# Patient Record
Sex: Male | Born: 2000
Health system: Southern US, Community
[De-identification: ages and names within clinical notes are randomized; demographics above are authoritative.]

## PROBLEM LIST (undated history)

## (undated) DIAGNOSIS — F209 Schizophrenia, unspecified: Secondary | ICD-10-CM

## (undated) DIAGNOSIS — F319 Bipolar disorder, unspecified: Secondary | ICD-10-CM

## (undated) DIAGNOSIS — F952 Tourette's disorder: Secondary | ICD-10-CM

---
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
## 2022-04-06 ENCOUNTER — Encounter (HOSPITAL_COMMUNITY): Payer: Self-pay

## 2022-04-06 ENCOUNTER — Emergency Department (HOSPITAL_COMMUNITY)
Admission: EM | Admit: 2022-04-06 | Discharge: 2022-04-07 | Disposition: A | Discharge status: Home / Self Care | Payer: Medicaid Other | Attending: Emergency Medicine | Admitting: Emergency Medicine

## 2022-04-06 DIAGNOSIS — Z20822 Contact with and (suspected) exposure to covid-19: Secondary | ICD-10-CM | POA: Insufficient documentation

## 2022-04-06 DIAGNOSIS — F39 Unspecified mood [affective] disorder: Secondary | ICD-10-CM | POA: Diagnosis not present

## 2022-04-06 DIAGNOSIS — F1290 Cannabis use, unspecified, uncomplicated: Secondary | ICD-10-CM | POA: Diagnosis not present

## 2022-04-06 DIAGNOSIS — Z046 Encounter for general psychiatric examination, requested by authority: Secondary | ICD-10-CM | POA: Diagnosis not present

## 2022-04-06 DIAGNOSIS — R440 Auditory hallucinations: Secondary | ICD-10-CM | POA: Diagnosis present

## 2022-04-06 LAB — CBC
HCT: 37.4 % — ABNORMAL LOW (ref 39.0–52.0)
Hemoglobin: 12.6 g/dL — ABNORMAL LOW (ref 13.0–17.0)
MCH: 22.1 pg — ABNORMAL LOW (ref 26.0–34.0)
MCHC: 33.7 g/dL (ref 30.0–36.0)
MCV: 65.5 fL — ABNORMAL LOW (ref 80.0–100.0)
Platelets: 232 10*3/uL (ref 150–400)
RBC: 5.71 MIL/uL (ref 4.22–5.81)
RDW: 18.8 % — ABNORMAL HIGH (ref 11.5–15.5)
WBC: 6.2 10*3/uL (ref 4.0–10.5)
nRBC: 0 % (ref 0.0–0.2)

## 2022-04-06 LAB — COMPREHENSIVE METABOLIC PANEL
ALT: 13 U/L (ref 0–44)
AST: 16 U/L (ref 15–41)
Albumin: 4.1 g/dL (ref 3.5–5.0)
Alkaline Phosphatase: 58 U/L (ref 38–126)
Anion gap: 6 (ref 5–15)
BUN: 8 mg/dL (ref 6–20)
CO2: 28 mmol/L (ref 22–32)
Calcium: 9.1 mg/dL (ref 8.9–10.3)
Chloride: 107 mmol/L (ref 98–111)
Creatinine, Ser: 0.91 mg/dL (ref 0.61–1.24)
GFR, Estimated: >60 mL/min (ref >60)
Glucose, Bld: 72 mg/dL (ref 70–99)
Potassium: 4.5 mmol/L (ref 3.5–5.1)
Sodium: 141 mmol/L (ref 135–145)
Total Bilirubin: 0.6 mg/dL (ref 0.3–1.2)
Total Protein: 7.2 g/dL (ref 6.5–8.1)

## 2022-04-06 LAB — RESP PANEL BY RT-PCR (FLU A&B, COVID) ARPGX2
Influenza A by PCR: NEGATIVE
Influenza B by PCR: NEGATIVE
SARS Coronavirus 2 by RT PCR: NEGATIVE

## 2022-04-06 LAB — ETHANOL: Alcohol, Ethyl (B): <10 mg/dL (ref <10)

## 2022-04-06 LAB — ACETAMINOPHEN LEVEL: Acetaminophen (Tylenol), Serum: <10 ug/mL — ABNORMAL LOW (ref 10–30)

## 2022-04-06 LAB — SALICYLATE LEVEL: Salicylate Lvl: <7 mg/dL — ABNORMAL LOW (ref 7.0–30.0)

## 2022-04-06 NOTE — ED Notes (Signed)
Pt has been seen and wand by security.   Pt has 2 bags of belongings.  Includes a cellphone  and ear pods.
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
## 2022-04-06 NOTE — ED Provider Triage Note (Signed)
Emergency Medicine Provider Triage Evaluation Note  Tawfiq Favila , a 21 y.o. male  was evaluated in triage.  Patient presents under IVC.  Per IVC patient responding to internal stimuli, physically aggressive.  Patient states hs does not need to be here- denies si, hi, hallucnations. Denies pain  Review of Systems  Per above  Physical Exam  BP 105/60 (BP Location: Left Arm)   Pulse 65   Temp 98.4 F (36.9 C) (Oral)   Resp 18   SpO2 100%  Gen:   Awake, no distress   Resp:  Normal effort  MSK:   Moves extremities without difficulty  Other:    Medical Decision Making  Medically screening exam initiated at 2:29 PM.  Appropriate orders placed.  Dawood Spitler was informed that the remainder of the evaluation will be completed by another provider, this initial triage assessment does not replace that evaluation, and the importance of remaining in the ED until their evaluation is complete.  IVC   Cherly Anderson, PA-C 04/06/22 1430
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
## 2022-04-07 DIAGNOSIS — F39 Unspecified mood [affective] disorder: Secondary | ICD-10-CM

## 2022-04-07 DIAGNOSIS — F1290 Cannabis use, unspecified, uncomplicated: Secondary | ICD-10-CM | POA: Diagnosis present

## 2022-04-07 DIAGNOSIS — Z046 Encounter for general psychiatric examination, requested by authority: Secondary | ICD-10-CM

## 2022-04-07 LAB — RAPID URINE DRUG SCREEN, HOSP PERFORMED
Amphetamines: NOT DETECTED
Barbiturates: NOT DETECTED
Benzodiazepines: NOT DETECTED
Cocaine: NOT DETECTED
Opiates: NOT DETECTED
Tetrahydrocannabinol: POSITIVE — AB

## 2022-04-07 NOTE — Discharge Instructions (Signed)
For your behavioral health needs you are advised to follow up with [facility].  They offer psychiatry, therapy and substance use disorder treatment.  Contact them at your earliest opportunity to schedule an intake appointment:       [facility]      [address] [place], [place] [address]      [phone]

## 2022-04-07 NOTE — Discharge Summary (Signed)
[facility] Psych ED Discharge  04/07/2022 12:12 PM Cody Garrett  MRN:  [id number]  Principal Problem: Unspecified mood (affective) disorder ([facility]) Discharge Diagnoses: Principal Problem:   Unspecified mood (affective) disorder (HCC) Active Problems:   Cannabis use disorder   Involuntary commitment  Clinical Impression:  Final diagnoses:  Involuntary commitment   Subjective:   Cody Garrett is a 21 y.o. 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  Recent inpatient Psychiatric hospitalization at [facility] last month.  HPI: On evaluation this morning, the patient patient states that he has a "decent" night. He continues to states that the content of the IVC are "not true." He denies suicidal ideations. He denies a history of suicide attempts. He denies homicidal ideations. He denies thoughts of wanting to hurt others. He denies auditory and visual hallucinations. No indication that he is responding to internal stimuli. He denies paranoia. No delusions elicited during this assessment. Patient reports a history of psychosis that occurred in August of 2022. He states that he was psychiatrically hospitalized at that time. He states that he did not use marijuana after that hospitalization until approximately 1.5 weeks ago. Had a lengthy discussion regarding the potential effects of marijuana. Patient states that he feels that he is thinking more clearly since he started smoking marijuana again. He feels that his psychosis last August was related to using edibles.    ED Assessment Time Calculation: Start Time: 0940 Stop Time: 1010 Total Time in Minutes (Assessment Completion): 30   Past Psychiatric History: Bipolar disorder and Cannabis induced mood disorder.  One inpatient Psychiatric hospitalization at [facility] last month.  Past Medical History:  Past Medical History:  Diagnosis Date   Sickle cell  anemia (HCC) 09/25/2020   Phreesia 11/10/2020   Tourette syndrome    History reviewed. No pertinent surgical history. Family History:  Family History  Problem Relation Age of Onset   Scoliosis Mother     Social History:  Social History   Substance and Sexual Activity  Alcohol Use Never     Social History   Substance and Sexual Activity  Drug Use Never   Types: Marijuana    Social History   Socioeconomic History   Marital status: Single    Spouse name: Not on file   Number of children: Not on file   Years of education: Not on file   Highest education level: Not on file  Occupational History   Not on file  Tobacco Use   Smoking status: Never   Smokeless tobacco: Never  Vaping Use   Vaping Use: Former  Substance and Sexual Activity   Alcohol use: Never   Drug use: Never    Types: Marijuana   Sexual activity: Not Currently    Birth control/protection: None  Other Topics Concern   Not on file  Social History Narrative   ** Merged History Encounter **       Social Determinants of Health   Financial Resource Strain: Not on file  Food Insecurity: Not on file  Transportation Needs: Not on file  Physical Activity: Not on file  Stress: Not on file  Social Connections: Not on file    Tobacco Cessation:  N/A, patient does not currently use tobacco products  Current Medications: No current facility-administered medications for this encounter.   Current Outpatient Medications  Medication Sig Dispense Refill   clindamycin (CLEOCIN T) 1 % lotion Apply 1 Application  topically daily as needed (skin  irritation on face).     cetirizine (ZYRTEC) 10 MG tablet Take 10 mg by mouth daily as needed.     feeding supplement (ENSURE ENLIVE / ENSURE PLUS) LIQD Take 237 mLs by mouth 3 (three) times daily between meals. (Patient not taking: Reported on 05/25/2021) 237 mL 12   Multiple Vitamin (MULTIVITAMIN WITH MINERALS) TABS tablet Take 1 tablet by mouth daily. (Patient not  taking: Reported on 04/06/2022)     OLANZapine (ZYPREXA) 10 MG tablet Take 10 mg by mouth at bedtime.     ondansetron (ZOFRAN ODT) 8 MG disintegrating tablet 8mg  ODT q4 hours prn nausea (Patient not taking: Reported on 05/25/2021) 10 tablet 0   polyethylene glycol (MIRALAX / GLYCOLAX) 17 g packet Take 17 g by mouth 2 (two) times daily. (Patient not taking: Reported on 05/25/2021) 14 each 0   PTA Medications: (Not in a hospital admission)   05/27/2021 Scale:  Flowsheet Row ED from 04/06/2022 in [facility] from 07/05/2021 in [facility] EMERGENCY DEPARTMENT ED from 05/24/2021 in [facility] EMERGENCY DEPARTMENT  C-SSRS RISK CATEGORY No Risk No Risk Error: Question 6 not populated       Musculoskeletal: Strength & Muscle Tone: within normal limits Gait & Station: normal Patient leans: N/A  Psychiatric Specialty Exam: Presentation  General Appearance: Appropriate for Environment; Casual  Eye Contact:Good  Speech:Clear and Coherent; Normal Rate  Speech Volume:Normal  Handedness:Right   Mood and Affect  Mood:Euthymic  Affect:Appropriate; Congruent   Thought Process  Thought Processes:Coherent; Linear; Goal Directed  Descriptions of Associations:Intact  Orientation:Full (Time, Place and Person)  Thought Content:Logical  History of Schizophrenia/Schizoaffective disorder:No data recorded Duration of Psychotic Symptoms:N/A  Hallucinations:Hallucinations: None  Ideas of Reference:None  Suicidal Thoughts:Suicidal Thoughts: No  Homicidal Thoughts:Homicidal Thoughts: No   Sensorium  Memory:Immediate Good; Recent Good; Remote Good  Judgment:Fair  Insight:Present   Executive Functions  Concentration:Good  Attention Span:Good  Recall:Good  Fund of Knowledge:Good  Language:Good   Psychomotor Activity  Psychomotor Activity:Psychomotor Activity: Normal   Assets  Assets:Communication Skills;  Physical Health; Housing; Social Support   Sleep  Sleep:Sleep: Good    Physical Exam: Physical Exam Constitutional:      General: He is not in acute distress.    Appearance: He is not ill-appearing, toxic-appearing or diaphoretic.  Pulmonary:     Effort: Pulmonary effort is normal. No respiratory distress.  Musculoskeletal:        General: Normal range of motion.  Neurological:     Mental Status: He is alert and oriented to person, place, and time.  Psychiatric:        Thought Content: Thought content is not paranoid or delusional. Thought content does not include homicidal or suicidal ideation.    Review of Systems  Constitutional:  Negative for chills and fever.  Psychiatric/Behavioral:  Positive for substance abuse. Negative for depression, hallucinations, memory loss and suicidal ideas. The patient is not nervous/anxious and does not have insomnia.    Blood pressure 129/73, pulse 94, temperature (!) 97.5 F (36.4 C), temperature source Oral, resp. rate 16, SpO2 100 %. There is no height or weight on file to calculate BMI.   Demographic Factors:  Male, Adolescent or young adult, and Unemployed  Loss Factors: NA  Historical Factors: Family history of mental illness or substance abuse  Risk Reduction Factors:   Living with another person, especially a relative and Positive social support  Continued Clinical Symptoms:  Alcohol/Substance Abuse/Dependencies Previous Psychiatric Diagnoses and Treatments  Cognitive Features That Contribute To Risk:  None    Suicide Risk:  Minimal: No identifiable suicidal ideation.  Patients presenting with no risk factors but with morbid ruminations; may be classified as minimal risk based on the severity of the depressive symptoms    Medical Decision Making: On evaluation this morning, the patient patient states that he has a "decent" night. He continues to states that the content of the IVC are "not true." He denies suicidal  ideations. He denies a history of suicide attempts. He denies homicidal ideations. He denies thoughts of wanting to hurt others. He denies auditory and visual hallucinations. No indication that he is responding to internal stimuli. He denies paranoia. No delusions elicited during this assessment.   Based on my evaluation, the patient does not appear to be an imminent risk to self or others. Patient does not meet IVC criteria. IVC rescinded by this provider.    Problem 1: Unspecified Mood Disorder  Problem 2: Cannabis use disorder  Disposition: No evidence of imminent risk to self or others at present.   Patient does not meet criteria for psychiatric inpatient admission. Supportive therapy provided about ongoing stressors. Discussed crisis plan, support from social network, calling 911, coming to the Emergency Department, and calling Suicide Hotline.   Jackelyn Poling, NP 04/07/2022, 12:12 PM

## 2022-04-07 NOTE — ED Provider Notes (Signed)
Emergency Medicine Observation Re-evaluation Note  Cody Garrett is a 21 y.o. male, seen on rounds today.  Pt initially presented to the ED for complaints of IVC Currently, the patient is resting comfortably.  Physical Exam  BP 114/63 (BP Location: Left Arm)   Pulse (!) 45   Temp (!) 97.5 F (36.4 C) (Oral)   Resp 18   SpO2 100%  Physical Exam General: NAD  ED Course / MDM  EKG:   I have reviewed the labs performed to date as well as medications administered while in observation.  Recent changes in the last 24 hours include no acute events reported.  Plan  Current plan is for pending psych reevaluation.  Najae Rathert is not under involuntary commitment.     Wynetta Fines, MD 04/07/22 [phone]

## 2022-04-07 NOTE — BH Assessment (Addendum)
[facility] Assessment Progress Note   Per [name], NP, this pt does not require psychiatric hospitalization at this time.  Pt presents under IVC initiated by pt's mother which has been rescinded by Barbara Cower.  Pt is psychiatrically cleared.  Discharge instructions advise pt to follow up with [facility] at his earliest opportunity.  EDP Kristine Royal, MD and pt's nurse, Nash Dimmer, have been notified.  Doylene Canning, MA Triage Specialist [phone]

## 2022-05-05 ENCOUNTER — Ambulatory Visit (HOSPITAL_COMMUNITY)
Admission: EM | Admit: 2022-05-05 | Discharge: 2022-05-08 | Disposition: A | Discharge status: Home / Self Care | Payer: Medicaid Other | Attending: Psychiatry | Admitting: Psychiatry

## 2022-05-05 DIAGNOSIS — Z91148 Patient's other noncompliance with medication regimen for other reason: Secondary | ICD-10-CM | POA: Insufficient documentation

## 2022-05-05 DIAGNOSIS — F23 Brief psychotic disorder: Secondary | ICD-10-CM | POA: Insufficient documentation

## 2022-05-05 DIAGNOSIS — Z20822 Contact with and (suspected) exposure to covid-19: Secondary | ICD-10-CM | POA: Diagnosis not present

## 2022-05-05 DIAGNOSIS — F1210 Cannabis abuse, uncomplicated: Secondary | ICD-10-CM | POA: Insufficient documentation

## 2022-05-05 DIAGNOSIS — F29 Unspecified psychosis not due to a substance or known physiological condition: Secondary | ICD-10-CM | POA: Diagnosis not present

## 2022-05-05 DIAGNOSIS — F319 Bipolar disorder, unspecified: Secondary | ICD-10-CM | POA: Insufficient documentation

## 2022-05-05 DIAGNOSIS — R4689 Other symptoms and signs involving appearance and behavior: Secondary | ICD-10-CM

## 2022-05-05 MED ORDER — ZIPRASIDONE MESYLATE 20 MG IM SOLR: 20.0000 mg | INTRAMUSCULAR | Status: DC | PRN

## 2022-05-05 MED ORDER — MAGNESIUM HYDROXIDE 400 MG/5ML PO SUSP: 30.0000 mL | Freq: Every day | ORAL | Status: DC | PRN

## 2022-05-05 MED ORDER — ALUM & MAG HYDROXIDE-SIMETH 200-200-20 MG/5ML PO SUSP: 30.0000 mL | ORAL | Status: DC | PRN

## 2022-05-05 MED ORDER — RISPERIDONE 2 MG PO TBDP
2.0000 mg | ORAL_TABLET | Freq: Three times a day (TID) | ORAL | Status: DC | PRN
  Administered 2022-05-06: 2 mg via ORAL
  Filled 2022-05-05: qty 1

## 2022-05-05 MED ORDER — LORAZEPAM 1 MG PO TABS: 1.0000 mg | ORAL_TABLET | ORAL | Status: DC | PRN

## 2022-05-05 MED ORDER — ACETAMINOPHEN 325 MG PO TABS: 650.0000 mg | ORAL_TABLET | Freq: Four times a day (QID) | ORAL | Status: DC | PRN
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
## 2022-05-06 LAB — COMPREHENSIVE METABOLIC PANEL
ALT: 10 U/L (ref 0–44)
AST: 16 U/L (ref 15–41)
Albumin: 4.2 g/dL (ref 3.5–5.0)
Alkaline Phosphatase: 50 U/L (ref 38–126)
Anion gap: 7 (ref 5–15)
BUN: 9 mg/dL (ref 6–20)
CO2: 25 mmol/L (ref 22–32)
Calcium: 9.4 mg/dL (ref 8.9–10.3)
Chloride: 105 mmol/L (ref 98–111)
Creatinine, Ser: 0.92 mg/dL (ref 0.61–1.24)
GFR, Estimated: >60 mL/min (ref >60)
Glucose, Bld: 100 mg/dL — ABNORMAL HIGH (ref 70–99)
Potassium: 3.7 mmol/L (ref 3.5–5.1)
Sodium: 137 mmol/L (ref 135–145)
Total Bilirubin: 0.7 mg/dL (ref 0.3–1.2)
Total Protein: 6.9 g/dL (ref 6.5–8.1)

## 2022-05-06 LAB — CBC WITH DIFFERENTIAL/PLATELET
Abs Immature Granulocytes: 0.01 10*3/uL (ref 0.00–0.07)
Basophils Absolute: 0 10*3/uL (ref 0.0–0.1)
Basophils Relative: 1 %
Eosinophils Absolute: 0 10*3/uL (ref 0.0–0.5)
Eosinophils Relative: 1 %
HCT: 36.8 % — ABNORMAL LOW (ref 39.0–52.0)
Hemoglobin: 12.7 g/dL — ABNORMAL LOW (ref 13.0–17.0)
Immature Granulocytes: 0 %
Lymphocytes Relative: 54 %
Lymphs Abs: 3.3 10*3/uL (ref 0.7–4.0)
MCH: 21.7 pg — ABNORMAL LOW (ref 26.0–34.0)
MCHC: 34.5 g/dL (ref 30.0–36.0)
MCV: 62.8 fL — ABNORMAL LOW (ref 80.0–100.0)
Monocytes Absolute: 0.4 10*3/uL (ref 0.1–1.0)
Monocytes Relative: 6 %
Neutro Abs: 2.4 10*3/uL (ref 1.7–7.7)
Neutrophils Relative %: 38 %
Platelets: 200 10*3/uL (ref 150–400)
RBC: 5.86 MIL/uL — ABNORMAL HIGH (ref 4.22–5.81)
RDW: 17.8 % — ABNORMAL HIGH (ref 11.5–15.5)
WBC: 6.1 10*3/uL (ref 4.0–10.5)
nRBC: 0 % (ref 0.0–0.2)

## 2022-05-06 LAB — RESP PANEL BY RT-PCR (FLU A&B, COVID) ARPGX2
Influenza A by PCR: NEGATIVE
Influenza B by PCR: NEGATIVE
SARS Coronavirus 2 by RT PCR: NEGATIVE

## 2022-05-06 LAB — LIPID PANEL
Cholesterol: 101 mg/dL (ref 0–200)
HDL: 58 mg/dL (ref >40)
LDL Cholesterol: 36 mg/dL (ref 0–99)
Total CHOL/HDL Ratio: 1.7 RATIO
Triglycerides: 34 mg/dL (ref <150)
VLDL: 7 mg/dL (ref 0–40)

## 2022-05-06 LAB — TSH: TSH: 0.394 u[IU]/mL (ref 0.350–4.500)

## 2022-05-06 LAB — ETHANOL: Alcohol, Ethyl (B): <10 mg/dL (ref <10)

## 2022-05-06 MED ORDER — ONDANSETRON 4 MG PO TBDP: 4.0000 mg | ORAL_TABLET | Freq: Three times a day (TID) | ORAL | Status: DC | PRN

## 2022-05-06 MED ORDER — POLYETHYLENE GLYCOL 3350 17 G PO PACK
17.0000 g | PACK | Freq: Two times a day (BID) | ORAL | Status: DC
  Administered 2022-05-06 – 2022-05-07 (×3): 17 g via ORAL
  Filled 2022-05-06 (×6): qty 1

## 2022-05-06 MED ORDER — OLANZAPINE 10 MG PO TABS
10.0000 mg | ORAL_TABLET | Freq: Every day | ORAL | Status: DC
  Administered 2022-05-07: 10 mg via ORAL
  Filled 2022-05-06 (×3): qty 1

## 2022-05-06 MED ORDER — OLANZAPINE 5 MG PO TABS
5.0000 mg | ORAL_TABLET | Freq: Every day | ORAL | Status: DC
  Administered 2022-05-06: 5 mg via ORAL
  Filled 2022-05-06: qty 1

## 2022-05-06 MED ORDER — OLANZAPINE 10 MG PO TABS
10.0000 mg | ORAL_TABLET | Freq: Every day | ORAL | Status: DC
  Administered 2022-05-06 (×2): 10 mg via ORAL
  Filled 2022-05-06 (×3): qty 1

## 2022-05-06 NOTE — ED Notes (Signed)
Patient sitting in obs area watching TV, periodically talking to himself. Patient ate a cheeseburger for lunch and took his Zyprexa with no issues. Writer informed him he was going to stay for observation, and he said "ok that sounds good." Will continue to monitor+

## 2022-05-06 NOTE — Progress Notes (Signed)
Inpatient Behavioral Health Placement  Pt meets inpatient criteria per Carlyn Reichert, MD.  Referral was sent to the following facilities;   Destination Service Provider Address Phone Fax  [facility]  [address] [place], [place] [address] [phone]  [facility]  [address] [place] [address] [phone]  [facility]  [address] [place], [place] [address] [phone]  [facility]  [address] [place] [address] [phone]  [facility]  [address] [place] [address] [phone]  [facility]  [address] [place] [address] [phone]  [facility]  [address] [place] [address] [phone]  [facility]  [address], [place] [address] [phone]  [facility]  [address] [place] [address] [phone]  [facility]  [address] [place] [address] [phone]    Situation ongoing,  CSW will follow up.   Maryjean Ka, MSW, LCSWA 05/06/2022  @ 5:20 PM

## 2022-05-06 NOTE — Progress Notes (Signed)
Pt pleasant and initiates conversations with staff and peers.  He is cooperative with care.  Pt reports that he is staying one more night at this location because he was asleep and "missed meeting with the doctor today".  He added that "I will just see them in the morning and request my discharge.   I have to be in [facility] tomorrow evening.  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

## 2022-05-06 NOTE — ED Notes (Signed)
Patient has been sleeping most of the shift, reminded him we need his urine drug screen, he stated he had just used the restroom but he would give the sample the next time he uses the restroom.

## 2022-05-06 NOTE — ED Notes (Signed)
Pt unable to provide urine specimen.  

## 2022-05-06 NOTE — ED Provider Notes (Signed)
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  Of note, the patient was recently admitted to [facility] in June 2023 for 5 days after presenting with psychotic behavior.  He was stabilized with Zyprexa.  The patient presented under involuntary commitment, with the forms being filled out by his mother, Letta Median [phone].  In the IVC documentation she reports that the patient has been not taking his medication, smoking marijuana, and has appeared psychotic, talking with other people, stating that he is God, and destroying property and threatening his mother.   Yesterday on admission assessment, the patient was noted to be psychotic with word salad and the patient was responding to internal stimuli.  Patient was started on Zyprexa 10 mg QHS.  On assessment today, the patient is initially linear and logical.  He denies the statements made in the involuntary commitment paperwork.  He denies experiencing homicidal thoughts.  When asked about the potential legal consequences of him attacking his mother, the patient states that he could go to jail.  He denies suicidal thoughts and denies experiencing auditory or visual hallucinations.  Later in the day, he is noted to be talking to unseen others and appears to be responding to internal stimuli.  He is informed that the IVC will be upheld and that he will continue to be monitored.   Diagnosis:  Final diagnoses:  Behavior concern  Noncompliance with medication regimen  Marijuana abuse, continuous  Psychosis, unspecified psychosis type ([place])    Total Time spent with patient: 30 minutes  Past Psychiatric History: as above Past Medical History:  Past Medical History:  Diagnosis Date   Sickle cell anemia ([place])  09/25/2020   Phreesia 11/10/2020   Tourette syndrome    No past surgical history on file. Family History:  Family History  Problem Relation Age of Onset   Scoliosis Mother    Family Psychiatric  History: per H and P Social History:  Social History   Substance and Sexual Activity  Alcohol Use Never     Social History   Substance and Sexual Activity  Drug Use Never   Types: Marijuana    Social History   Socioeconomic History   Marital status: Single    Spouse name: Not on file   Number of children: Not on file   Years of education: Not on file   Highest education level: Not on file  Occupational History   Not on file  Tobacco Use   Smoking status: Never   Smokeless tobacco: Never  Vaping Use   Vaping Use: Former  Substance and Sexual Activity   Alcohol use: Never   Drug use: Never    Types: Marijuana   Sexual activity: Not Currently    Birth control/protection: None  Other Topics Concern   Not on file  Social History Narrative   ** Merged History Encounter **       Social Determinants of Health   Financial Resource Strain: Not on file  Food Insecurity: Not on file  Transportation Needs: Not on file  Physical Activity: Not on file  Stress: Not on file  Social Connections: Not on file   SDOH:  SDOH Screenings   Alcohol Screen: Not on file  Depression (PHQ2-9): Low Risk  (11/13/2020)   Depression (PHQ2-9)  PHQ-2 Score: 0  Financial Resource Strain: Not on file  Food Insecurity: Not on file  Housing: Not on file  Physical Activity: Not on file  Social Connections: Not on file  Stress: Not on file  Tobacco Use: Low Risk  (04/06/2022)   Patient History    Smoking Tobacco Use: Never    Smokeless Tobacco Use: Never    Passive Exposure: Not on file  Transportation Needs: Not on file   Additional Social History:    Pain Medications: See MAR Prescriptions: See MAR Over the Counter: See MAR History of alcohol / drug use?: Yes Longest period of  sobriety (when/how long): Unknown Negative Consequences of Use:  (UTA) Withdrawal Symptoms:  (UTA) Name of Substance 1: THC 1 - Age of First Use: Unknown 1 - Amount (size/oz): Pt states 1 ounce/day; when questioned as to whether pt was sure about that amount he didn't answer the question 1 - Frequency: Daily 1 - Duration: Ongoing 1 - Last Use / Amount: Today 1 - Method of Aquiring: Unknown 1- Route of Use: Smoking                  Sleep: Fair  Appetite:  Fair  Current Medications:  Current Facility-Administered Medications  Medication Dose Route Frequency Provider Last Rate Last Admin   acetaminophen (TYLENOL) tablet 650 mg  650 mg Oral Q6H PRN Evette Georges, NP       alum & mag hydroxide-simeth (MAALOX/MYLANTA) 200-200-20 MG/5ML suspension 30 mL  30 mL Oral Q4H PRN Evette Georges, NP       risperiDONE (RISPERDAL M-TABS) disintegrating tablet 2 mg  2 mg Oral Q8H PRN Evette Georges, NP       And   LORazepam (ATIVAN) tablet 1 mg  1 mg Oral PRN Evette Georges, NP       And   ziprasidone (GEODON) injection 20 mg  20 mg Intramuscular PRN Evette Georges, NP       magnesium hydroxide (MILK OF MAGNESIA) suspension 30 mL  30 mL Oral Daily PRN Evette Georges, NP       OLANZapine (ZYPREXA) tablet 10 mg  10 mg Oral QHS Evette Georges, NP   10 mg at 05/06/22 0032   OLANZapine (ZYPREXA) tablet 5 mg  5 mg Oral Daily Corky Sox, MD   5 mg at 05/06/22 1228   ondansetron (ZOFRAN-ODT) disintegrating tablet 4 mg  4 mg Oral Q8H PRN Evette Georges, NP       polyethylene glycol (MIRALAX / GLYCOLAX) packet 17 g  17 g Oral BID Evette Georges, NP   17 g at 05/06/22 0039   Current Outpatient Medications  Medication Sig Dispense Refill   acetaminophen (TYLENOL) 500 MG tablet Take 1,000 mg by mouth every 6 (six) hours as needed for headache.      Labs  Lab Results:  Admission on 05/05/2022  Component Date Value Ref Range Status   SARS Coronavirus 2 by RT PCR 05/05/2022 NEGATIVE  NEGATIVE Final    Comment: (NOTE) SARS-CoV-2 target nucleic acids are NOT DETECTED.  The SARS-CoV-2 RNA is generally detectable in upper respiratory specimens during the acute phase of infection. The lowest concentration of SARS-CoV-2 viral copies this assay can detect is 138 copies/mL. A negative result does not preclude SARS-Cov-2 infection and should not be used as the sole basis for treatment or other patient management decisions. A negative result may occur with  improper specimen collection/handling, submission of specimen other than nasopharyngeal swab, presence of viral mutation(s) within  the areas targeted by this assay, and inadequate number of viral copies(<138 copies/mL). A negative result must be combined with clinical observations, patient history, and epidemiological information. The expected result is Negative.  Fact Sheet for Patients:  BloggerCourse.com  Fact Sheet for Healthcare Providers:  SeriousBroker.it  This test is no                          t yet approved or cleared by the [place] FDA and  has been authorized for detection and/or diagnosis of SARS-CoV-2 by FDA under an Emergency Use Authorization (EUA). This EUA will remain  in effect (meaning this test can be used) for the duration of the COVID-19 declaration under Section 564(b)(1) of the Act, 21 U.S.C.section 360bbb-3(b)(1), unless the authorization is terminated  or revoked sooner.       Influenza A by PCR 05/05/2022 NEGATIVE  NEGATIVE Final   Influenza B by PCR 05/05/2022 NEGATIVE  NEGATIVE Final   Comment: (NOTE) The Xpert Xpress SARS-CoV-2/FLU/RSV plus assay is intended as an aid in the diagnosis of influenza from Nasopharyngeal swab specimens and should not be used as a sole basis for treatment. Nasal washings and aspirates are unacceptable for Xpert Xpress SARS-CoV-2/FLU/RSV testing.  Fact Sheet for  Patients: BloggerCourse.com  Fact Sheet for Healthcare Providers: SeriousBroker.it  This test is not yet approved or cleared by the [place] FDA and has been authorized for detection and/or diagnosis of SARS-CoV-2 by FDA under an Emergency Use Authorization (EUA). This EUA will remain in effect (meaning this test can be used) for the duration of the COVID-19 declaration under Section 564(b)(1) of the Act, 21 U.S.C. section 360bbb-3(b)(1), unless the authorization is terminated or revoked.  Performed at [facility], [address], [place], [place] [address]    WBC 05/05/2022 6.1  4.0 - 10.5 K/uL Final   RBC 05/05/2022 5.86 (H)  4.22 - 5.81 MIL/uL Final   Hemoglobin 05/05/2022 12.7 (L)  13.0 - 17.0 g/dL Final   HCT 74/08/8785 36.8 (L)  39.0 - 52.0 % Final   MCV 05/05/2022 62.8 (L)  80.0 - 100.0 fL Final   MCH 05/05/2022 21.7 (L)  26.0 - 34.0 pg Final   MCHC 05/05/2022 34.5  30.0 - 36.0 g/dL Final   RDW 76/72/0947 17.8 (H)  11.5 - 15.5 % Final   Platelets 05/05/2022 200  150 - 400 K/uL Final   REPEATED TO VERIFY   nRBC 05/05/2022 0.0  0.0 - 0.2 % Final   Neutrophils Relative % 05/05/2022 38  % Final   Neutro Abs 05/05/2022 2.4  1.7 - 7.7 K/uL Final   Lymphocytes Relative 05/05/2022 54  % Final   Lymphs Abs 05/05/2022 3.3  0.7 - 4.0 K/uL Final   Monocytes Relative 05/05/2022 6  % Final   Monocytes Absolute 05/05/2022 0.4  0.1 - 1.0 K/uL Final   Eosinophils Relative 05/05/2022 1  % Final   Eosinophils Absolute 05/05/2022 0.0  0.0 - 0.5 K/uL Final   Basophils Relative 05/05/2022 1  % Final   Basophils Absolute 05/05/2022 0.0  0.0 - 0.1 K/uL Final   Immature Granulocytes 05/05/2022 0  % Final   Abs Immature Granulocytes 05/05/2022 0.01  0.00 - 0.07 K/uL Final   Performed at [facility], [address] [place], [place] [address]   Sodium 05/05/2022 137  135 - 145 mmol/L Final   Potassium 05/05/2022 3.7  3.5 - 5.1  mmol/L Final  Chloride 05/05/2022 105  98 - 111 mmol/L Final   CO2 05/05/2022 25  22 - 32 mmol/L Final   Glucose, Bld 05/05/2022 100 (H)  70 - 99 mg/dL Final   Glucose reference range applies only to samples taken after fasting for at least 8 hours.   BUN 05/05/2022 9  6 - 20 mg/dL Final   Creatinine, Ser 05/05/2022 0.92  0.61 - 1.24 mg/dL Final   Calcium 05/05/2022 9.4  8.9 - 10.3 mg/dL Final   Total Protein 05/05/2022 6.9  6.5 - 8.1 g/dL Final   Albumin 05/05/2022 4.2  3.5 - 5.0 g/dL Final   AST 05/05/2022 16  15 - 41 U/L Final   ALT 05/05/2022 10  0 - 44 U/L Final   Alkaline Phosphatase 05/05/2022 50  38 - 126 U/L Final   Total Bilirubin 05/05/2022 0.7  0.3 - 1.2 mg/dL Final   GFR, Estimated 05/05/2022 >60  >60 mL/min Final   Comment: (NOTE) Calculated using the CKD-EPI Creatinine Equation (2021)    Anion gap 05/05/2022 7  5 - 15 Final   Performed at [facility] [address], [place], [place] [address]   Alcohol, Ethyl (B) 05/05/2022 <10  <10 mg/dL Final   Comment: (NOTE) Lowest detectable limit for serum alcohol is 10 mg/dL.  For medical purposes only. Performed at [facility], [place] [address] [place], [place] [address]    Cholesterol 05/05/2022 101  0 - 200 mg/dL Final   Triglycerides 05/05/2022 34  <150 mg/dL Final   HDL 05/05/2022 58  >40 mg/dL Final   Total CHOL/HDL Ratio 05/05/2022 1.7  RATIO Final   VLDL 05/05/2022 7  0 - 40 mg/dL Final   LDL Cholesterol 05/05/2022 36  0 - 99 mg/dL Final   Comment:        Total Cholesterol/HDL:CHD Risk Coronary Heart Disease Risk Table                     Men   Women  1/2 Average Risk   3.4   3.3  Average Risk       5.0   4.4  2 X Average Risk   9.6   7.1  3 X Average Risk  23.4   11.0        Use the calculated Patient Ratio above and the CHD Risk Table to determine the patient's CHD Risk.        ATP III CLASSIFICATION (LDL):  <100     mg/dL   Optimal  100-129  mg/dL   Near or Above                     Optimal  130-159  mg/dL   Borderline  160-189  mg/dL   High  >190     mg/dL   Very High Performed at [facility] [address], [place], [place] [address]    TSH 05/05/2022 0.394  0.350 - 4.500 uIU/mL Final   Comment: Performed by a 3rd Generation assay with a functional sensitivity of <=0.01 uIU/mL. Performed at [facility], [place] [address] [place], [place] [address]   Admission on 04/06/2022, Discharged on 04/07/2022  Component Date Value Ref Range Status   Sodium 04/06/2022 141  135 - 145 mmol/L Final   Potassium 04/06/2022 4.5  3.5 - 5.1 mmol/L Final   Chloride 04/06/2022 107  98 - 111 mmol/L Final   CO2 04/06/2022 28  22 - 32 mmol/L Final   Glucose, Bld 04/06/2022 72  70 - 99 mg/dL Final   Glucose reference range applies only to samples taken after fasting for at least 8 hours.   BUN 04/06/2022 8  6 - 20 mg/dL Final   Creatinine, Ser 04/06/2022 0.91  0.61 - 1.24 mg/dL Final   Calcium 04/06/2022 9.1  8.9 - 10.3 mg/dL Final   Total Protein 04/06/2022 7.2  6.5 - 8.1 g/dL Final   Albumin 04/06/2022 4.1  3.5 - 5.0 g/dL Final   AST 04/06/2022 16  15 - 41 U/L Final   ALT 04/06/2022 13  0 - 44 U/L Final   Alkaline Phosphatase 04/06/2022 58  38 - 126 U/L Final   Total Bilirubin 04/06/2022 0.6  0.3 - 1.2 mg/dL Final   GFR, Estimated 04/06/2022 >60  >60 mL/min Final   Comment: (NOTE) Calculated using the CKD-EPI Creatinine Equation (2021)    Anion gap 04/06/2022 6  5 - 15 Final   Performed at [facility], [place] [address], [place], [place] [address]   WBC 04/06/2022 6.2  4.0 - 10.5 K/uL Final   RBC 04/06/2022 5.71  4.22 - 5.81 MIL/uL Final   Hemoglobin 04/06/2022 12.6 (L)  13.0 - 17.0 g/dL Final   HCT 04/06/2022 37.4 (L)  39.0 - 52.0 % Final   MCV 04/06/2022 65.5 (L)  80.0 - 100.0 fL Final   MCH 04/06/2022 22.1 (L)  26.0 - 34.0 pg Final   MCHC 04/06/2022 33.7  30.0 - 36.0 g/dL Final   RDW 04/06/2022 18.8 (H)  11.5 - 15.5 % Final   Platelets  04/06/2022 232  150 - 400 K/uL Final   Comment: SPECIMEN CHECKED FOR CLOTS REPEATED TO VERIFY PLATELET COUNT CONFIRMED BY SMEAR    nRBC 04/06/2022 0.0  0.0 - 0.2 % Final   Performed at [facility], [place] [address], [place], [place] [id number]   Salicylate Lvl 123XX123 <7.0 (L)  7.0 - 30.0 mg/dL Final   Performed at [facility] [address], [place], [place] [address]   Acetaminophen (Tylenol), Serum 04/06/2022 <10 (L)  10 - 30 ug/mL Final   Comment: (NOTE) Therapeutic concentrations vary significantly. A range of 10-30 ug/mL  may be an effective concentration for many patients. However, some  are best treated at concentrations outside of this range. Acetaminophen concentrations >150 ug/mL at 4 hours after ingestion  and >50 ug/mL at 12 hours after ingestion are often associated with  toxic reactions.  Performed at [facility], [place] [address], [place], [place] [address]    Alcohol, Ethyl (B) 04/06/2022 <10  <10 mg/dL Final   Comment: (NOTE) Lowest detectable limit for serum alcohol is 10 mg/dL.  For medical purposes only. Performed at [facility], [place] [address] [place], [place] [address]    Opiates 04/06/2022 NONE DETECTED  NONE DETECTED Final   Cocaine 04/06/2022 NONE DETECTED  NONE DETECTED Final   Benzodiazepines 04/06/2022 NONE DETECTED  NONE DETECTED Final   Amphetamines 04/06/2022 NONE DETECTED  NONE DETECTED Final   Tetrahydrocannabinol 04/06/2022 POSITIVE (A)  NONE DETECTED Final   Barbiturates 04/06/2022 NONE DETECTED  NONE DETECTED Final   Comment: (NOTE) DRUG SCREEN FOR MEDICAL PURPOSES ONLY.  IF CONFIRMATION IS NEEDED FOR ANY PURPOSE, NOTIFY LAB WITHIN 5 DAYS.  LOWEST DETECTABLE LIMITS FOR URINE DRUG SCREEN Drug Class  Cutoff (ng/mL) Amphetamine and metabolites    1000 Barbiturate and metabolites    200 Benzodiazepine                 200 Tricyclics and  metabolites     300 Opiates and metabolites        300 Cocaine and metabolites        300 THC                            50 Performed at [facility], [address] [place], [place] [address]    SARS Coronavirus 2 by RT PCR 04/06/2022 NEGATIVE  NEGATIVE Final   Comment: (NOTE) SARS-CoV-2 target nucleic acids are NOT DETECTED.  The SARS-CoV-2 RNA is generally detectable in upper respiratory specimens during the acute phase of infection. The lowest concentration of SARS-CoV-2 viral copies this assay can detect is 138 copies/mL. A negative result does not preclude SARS-Cov-2 infection and should not be used as the sole basis for treatment or other patient management decisions. A negative result may occur with  improper specimen collection/handling, submission of specimen other than nasopharyngeal swab, presence of viral mutation(s) within the areas targeted by this assay, and inadequate number of viral copies(<138 copies/mL). A negative result must be combined with clinical observations, patient history, and epidemiological information. The expected result is Negative.  Fact Sheet for Patients:  BloggerCourse.com  Fact Sheet for Healthcare Providers:  SeriousBroker.it  This test is no                          t yet approved or cleared by the [place] FDA and  has been authorized for detection and/or diagnosis of SARS-CoV-2 by FDA under an Emergency Use Authorization (EUA). This EUA will remain  in effect (meaning this test can be used) for the duration of the COVID-19 declaration under Section 564(b)(1) of the Act, 21 U.S.C.section 360bbb-3(b)(1), unless the authorization is terminated  or revoked sooner.       Influenza A by PCR 04/06/2022 NEGATIVE  NEGATIVE Final   Influenza B by PCR 04/06/2022 NEGATIVE  NEGATIVE Final   Comment: (NOTE) The Xpert Xpress SARS-CoV-2/FLU/RSV plus assay is intended as an  aid in the diagnosis of influenza from Nasopharyngeal swab specimens and should not be used as a sole basis for treatment. Nasal washings and aspirates are unacceptable for Xpert Xpress SARS-CoV-2/FLU/RSV testing.  Fact Sheet for Patients: BloggerCourse.com  Fact Sheet for Healthcare Providers: SeriousBroker.it  This test is not yet approved or cleared by the [place] FDA and has been authorized for detection and/or diagnosis of SARS-CoV-2 by FDA under an Emergency Use Authorization (EUA). This EUA will remain in effect (meaning this test can be used) for the duration of the COVID-19 declaration under Section 564(b)(1) of the Act, 21 U.S.C. section 360bbb-3(b)(1), unless the authorization is terminated or revoked.  Performed at [facility], [address] [place], [place] [address]     Blood Alcohol level:  Lab Results  Component Value Date   ETH <10 05/05/2022   ETH <10 04/06/2022    Metabolic Disorder Labs: No results found for: "HGBA1C", "MPG" No results found for: "PROLACTIN" Lab Results  Component Value Date   CHOL 101 05/05/2022   TRIG 34 05/05/2022   HDL 58 05/05/2022   CHOLHDL 1.7 05/05/2022   VLDL 7 05/05/2022   LDLCALC 36 05/05/2022  Therapeutic Lab Levels: No results found for: "LITHIUM" Lab Results  Component Value Date   VALPROATE 122 (H) 05/27/2021   No results found for: "CBMZ"  Physical Findings   PHQ2-9    [place] Office Visit from 11/13/2020 in [facility] ED from 09/01/2020 in [facility]  PHQ-2 Total Score 0 2  PHQ-9 Total Score 0 3      [place] ED from 05/05/2022 in [facility] ED from 04/06/2022 in [facility] DEPT ED from 07/05/2021 in [facility] No Risk No Risk No Risk         Musculoskeletal  Strength & Muscle Tone: within normal limits Gait & Station: normal Patient leans: N/A  Psychiatric Specialty Exam  Presentation  General Appearance: Casual  Eye Contact:Fleeting  Speech:Clear and Coherent  Speech Volume:Normal  Handedness:Ambidextrous   Mood and Affect  Mood:Euthymic  Affect:Constricted   Thought Process  Thought Processes:Coherent  Descriptions of Associations:Intact  Orientation:Full (Time, Place and Person)  Thought Content:Logical  Diagnosis of Schizophrenia or Schizoaffective disorder in past: No  Duration of Psychotic Symptoms: Greater than six months   Hallucinations:Hallucinations: -- (patient is noted to be talking with unseen others)  Ideas of Reference:None  Suicidal Thoughts:Suicidal Thoughts: No  Homicidal Thoughts:Homicidal Thoughts: No   Sensorium  Memory:Immediate Good; Recent Good; Remote Good  Judgment:Poor  Insight:Poor   Executive Functions  Concentration:Fair  Attention Span:Fair  [place] of Knowledge:Fair  Language:Fair   Psychomotor Activity  Psychomotor Activity:Psychomotor Activity: Normal   Assets  Assets:Desire for Improvement; Communication Skills   Sleep  Sleep:Sleep: Fair   Nutritional Assessment (For OBS and FBC admissions only) Has the patient had a weight loss or gain of 10 pounds or more in the last 3 months?: No Has the patient had a decrease in food intake/or appetite?: No Does the patient have dental problems?: No Does the patient have eating habits or behaviors that may be indicators of an eating disorder including binging or inducing vomiting?: No Has the patient recently lost weight without trying?: 0 Has the patient been eating poorly because of a decreased appetite?: 0 Malnutrition Screening Tool Score: 0    Physical Exam  Physical Exam Constitutional:      Appearance: the patient is not toxic-appearing.  Pulmonary:     Effort: Pulmonary  effort is normal.  Neurological:     General: No focal deficit present.     Mental Status: the patient is alert and oriented to person, place, and time.   Review of Systems  Respiratory:  Negative for shortness of breath.   Cardiovascular:  Negative for chest pain.  Gastrointestinal:  Negative for abdominal pain, constipation, diarrhea, nausea and vomiting.  Neurological:  Negative for headaches.   Blood pressure 119/73, pulse 69, temperature 97.6 F (36.4 C), temperature source Oral, resp. rate 20, SpO2 100 %. There is no height or weight on file to calculate BMI.  Treatment Plan Summary: Daily contact with patient to assess and evaluate symptoms and progress in treatment and Medication management  Status: Involuntary admission to the observation unit Dispo: LCSW to fax out patient  Psychosis Patient denies having any home medications, none found -Zyprexa 5 mg daily for psychosis -Continue Zyprexa 10 mg QHS for psychosis -Agitation protocol  Labs Hgb of 12.7 UDS: not yet collected    Corky Sox, MD 05/06/2022 4:20 PM

## 2022-05-06 NOTE — ED Notes (Signed)
Pt was given dinner. 

## 2022-05-06 NOTE — ED Notes (Signed)
Patient noted to be talking to himself in the milleu. Patient denies SI/HI/AVH but he is talking to someone who is not there. Dr. Jerrel Ivory notified.

## 2022-05-06 NOTE — ED Notes (Signed)
Observed pacing unit having conversation with self while twisting his hair appears to be responding to internal stimuli. Affect flat mood labile  but no aggressive behavior noted fair interaction with staff and peers.

## 2022-05-06 NOTE — Progress Notes (Signed)
Pt provided peanut butter and jelly sandwich, baked potato chips and juice as requested, after arriving to the unit.

## 2022-05-06 NOTE — ED Notes (Signed)
Sleeping no distress or disturbed sleep noted respiration are easy skin color appropriate for ethnicity.

## 2022-05-06 NOTE — Discharge Instructions (Addendum)
Patient is instructed prior to discharge to:  Take all medications as prescribed by his/her mental healthcare provider. Report any adverse effects and or reactions from the medicines to his/her outpatient provider promptly. Keep all scheduled appointments, to ensure that you are getting refills on time and to avoid any interruption in your medication.  If you are unable to keep an appointment call to reschedule.  Be sure to follow-up with resources and follow-up appointments provided.  Patient has been instructed & cautioned: To not engage in alcohol and or illegal drug use while on prescription medicines. In the event of worsening symptoms, patient is instructed to call the crisis hotline, 911 and or go to the nearest ED for appropriate evaluation and treatment of symptoms. To follow-up with his/her primary care provider for your other medical issues, concerns and or health care needs.   Please come to [facility] during walk in hours to establish care for medication management or therapy:   Address:  [address], in [place], [address] Ph: [phone]   New patient medication management hours: Mondays, Wednesdays, Thursdays, and Fridays from 8 AM to 11 AM Please show up by 7:00 AM to ensure you are seen  New patient therapy walk in hours: Mondays and Wednesdays from 8 AM to 11 AM Please show up by 7:00 AM to ensure you are seen  

## 2022-05-07 LAB — POCT URINE DRUG SCREEN - MANUAL ENTRY (I-SCREEN)
POC Amphetamine UR: NOT DETECTED
POC Buprenorphine (BUP): NOT DETECTED
POC Cocaine UR: NOT DETECTED
POC Marijuana UR: POSITIVE — AB
POC Methadone UR: NOT DETECTED
POC Methamphetamine UR: NOT DETECTED
POC Morphine: NOT DETECTED
POC Oxazepam (BZO): NOT DETECTED
POC Oxycodone UR: NOT DETECTED
POC Secobarbital (BAR): NOT DETECTED

## 2022-05-07 LAB — HEMOGLOBIN A1C
Hgb A1c MFr Bld: 5 % (ref 4.8–5.6)
Mean Plasma Glucose: 97 mg/dL

## 2022-05-07 NOTE — ED Notes (Signed)
Sleeping no distress noted respirations easy noted by positive rise and fall of chest skin color appropriate for ethnicity.

## 2022-05-07 NOTE — ED Notes (Signed)
Patient walking around unit and make several request. Respirations equal and unlabored, skin warm and dry, NAD. No change in assessment or acuity. Safety checks in place.

## 2022-05-07 NOTE — ED Notes (Signed)
Currently sleeping  no distressed or disturbed sleep patterns noted. Respirations are  16 and easy skin colr is appropriate for ethnicity.

## 2022-05-07 NOTE — ED Notes (Signed)
Patient A&Ox4. Patient denies SI/HI and AVH. Patient denies any physical complaints when asked. No acute distress noted. Support and encouragement provided. Routine safety checks conducted according to facility protocol. Encouraged patient to notify staff if thoughts of harm toward self or others arise. Patient verbalize understanding and agreement. Will continue to monitor for safety.    

## 2022-05-07 NOTE — ED Notes (Signed)
Patient states he does not take medication. He refused his scheduled night medication.

## 2022-05-07 NOTE — ED Notes (Signed)
Patient complains of pain and refused to take Tylenol when offered.

## 2022-05-07 NOTE — ED Provider Notes (Signed)
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  Of note, the patient was recently admitted to [facility] in June 2023 for 5 days after presenting with psychotic behavior.  He was stabilized with Zyprexa.  The patient presented under involuntary commitment, with the forms being filled out by his mother, Lovenia Kimadine Green [phone].  In the IVC documentation she reports that the patient has been not taking his medication, smoking marijuana, and has appeared psychotic, talking with other people, stating that he is God, and destroying property and threatening his mother. On admission assessment, the patient was noted to be psychotic with word salad and the patient was responding to internal stimuli.  Patient was started on Zyprexa 10 mg QHS. This has since been increased to Zyprexa 10 mg BID.   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 On assessment this morning the patient reports that the contract is worth several million dollars. 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 He is informed that the plan is for continued observation.   Diagnosis:  Final diagnoses:  Behavior concern  Noncompliance with medication regimen  Marijuana abuse, continuous  Psychosis, unspecified psychosis type (HCC)    Total Time spent with patient:  15 minutes  Past Psychiatric History: as above Past Medical History:  Past Medical History:  Diagnosis Date   Sickle cell anemia (HCC) 09/25/2020   Phreesia 11/10/2020   Tourette syndrome    No past surgical history on file. Family History:  Family History  Problem Relation Age of  Onset   Scoliosis Mother    Family Psychiatric  History: per H and P Social History:  Social History   Substance and Sexual Activity  Alcohol Use Never     Social History   Substance and Sexual Activity  Drug Use Never   Types: Marijuana    Social History   Socioeconomic History   Marital status: Single    Spouse name: Not on file   Number of children: Not on file   Years of education: Not on file   Highest education level: Not on file  Occupational History   Not on file  Tobacco Use   Smoking status: Never   Smokeless tobacco: Never  Vaping Use   Vaping Use: Former  Substance and Sexual Activity   Alcohol use: Never   Drug use: Never    Types: Marijuana   Sexual activity: Not Currently    Birth control/protection: None  Other Topics Concern   Not on file  Social History Narrative   ** Merged History Encounter **       Social Determinants of Health   Financial Resource Strain: Not on file  Food Insecurity: Not on file  Transportation Needs: Not on file  Physical Activity: Not on file  Stress: Not on file  Social Connections: Not on file   SDOH:  SDOH Screenings   Alcohol Screen: Not on file  Depression (PHQ2-9): Low Risk  (11/13/2020)   Depression (PHQ2-9)    PHQ-2 Score: 0  Financial Resource Strain: Not on file  Food Insecurity: Not on file  Housing: Not on file  Physical Activity: Not on file  Social Connections: Not on file  Stress: Not on file  Tobacco Use: Low Risk  (04/06/2022)   Patient History    Smoking Tobacco Use: Never    Smokeless Tobacco Use: Never    Passive Exposure: Not on file  Transportation Needs: Not on file   Additional Social History:    Pain Medications: See MAR Prescriptions: See MAR Over the Counter: See MAR History of alcohol / drug use?: Yes Longest period of sobriety (when/how long): Unknown Negative Consequences of Use:  (UTA) Withdrawal Symptoms:  (UTA) Name of Substance 1: THC 1 - Age of First Use:  Unknown 1 - Amount (size/oz): Pt states 1 ounce/day; when questioned as to whether pt was sure about that amount he didn't answer the question 1 - Frequency: Daily 1 - Duration: Ongoing 1 - Last Use / Amount: Today 1 - Method of Aquiring: Unknown 1- Route of Use: Smoking                  Sleep: Fair  Appetite:  Fair  Current Medications:  Current Facility-Administered Medications  Medication Dose Route Frequency Provider Last Rate Last Admin   acetaminophen (TYLENOL) tablet 650 mg  650 mg Oral Q6H PRN Sindy Guadeloupe, NP       alum & mag hydroxide-simeth (MAALOX/MYLANTA) 200-200-20 MG/5ML suspension 30 mL  30 mL Oral Q4H PRN Sindy Guadeloupe, NP       risperiDONE (RISPERDAL M-TABS) disintegrating tablet 2 mg  2 mg Oral Q8H PRN Sindy Guadeloupe, NP   2 mg at 05/06/22 2130   And   LORazepam (ATIVAN) tablet 1 mg  1 mg Oral PRN Sindy Guadeloupe, NP       And   ziprasidone (GEODON) injection 20 mg  20 mg Intramuscular PRN Sindy Guadeloupe, NP       magnesium hydroxide (MILK OF MAGNESIA) suspension 30 mL  30 mL Oral Daily PRN Sindy Guadeloupe, NP       OLANZapine (ZYPREXA) tablet 10 mg  10 mg Oral QHS Sindy Guadeloupe, NP   10 mg at 05/06/22 2129   OLANZapine (ZYPREXA) tablet 10 mg  10 mg Oral Daily Carlyn Reichert, MD       ondansetron (ZOFRAN-ODT) disintegrating tablet 4 mg  4 mg Oral Q8H PRN Sindy Guadeloupe, NP       polyethylene glycol (MIRALAX / GLYCOLAX) packet 17 g  17 g Oral BID Sindy Guadeloupe, NP   17 g at 05/06/22 2201   Current Outpatient Medications  Medication Sig Dispense Refill   acetaminophen (TYLENOL) 500 MG tablet Take 1,000 mg by mouth every 6 (six) hours as needed for headache.      Labs  Lab Results:  Admission on 05/05/2022  Component Date Value Ref Range Status   SARS Coronavirus 2 by RT PCR 05/05/2022 NEGATIVE  NEGATIVE Final   Comment: (NOTE) SARS-CoV-2 target nucleic acids are NOT DETECTED.  The SARS-CoV-2 RNA is generally detectable in upper respiratory specimens  during the acute phase of infection. The lowest concentration of SARS-CoV-2 viral copies this assay can detect is 138 copies/mL. A negative result does not preclude SARS-Cov-2 infection and should not be used as the sole basis for treatment or other patient management decisions. A negative result may occur with  improper specimen collection/handling, submission of specimen other than nasopharyngeal swab, presence of viral mutation(s) within the areas targeted by this assay, and inadequate number of viral copies(<138 copies/mL). A negative result must be combined with clinical observations,  patient history, and epidemiological information. The expected result is Negative.  Fact Sheet for Patients:  BloggerCourse.com  Fact Sheet for Healthcare Providers:  SeriousBroker.it  This test is no                          t yet approved or cleared by the [place] FDA and  has been authorized for detection and/or diagnosis of SARS-CoV-2 by FDA under an Emergency Use Authorization (EUA). This EUA will remain  in effect (meaning this test can be used) for the duration of the COVID-19 declaration under Section 564(b)(1) of the Act, 21 U.S.C.section 360bbb-3(b)(1), unless the authorization is terminated  or revoked sooner.       Influenza A by PCR 05/05/2022 NEGATIVE  NEGATIVE Final   Influenza B by PCR 05/05/2022 NEGATIVE  NEGATIVE Final   Comment: (NOTE) The Xpert Xpress SARS-CoV-2/FLU/RSV plus assay is intended as an aid in the diagnosis of influenza from Nasopharyngeal swab specimens and should not be used as a sole basis for treatment. Nasal washings and aspirates are unacceptable for Xpert Xpress SARS-CoV-2/FLU/RSV testing.  Fact Sheet for Patients: BloggerCourse.com  Fact Sheet for Healthcare Providers: SeriousBroker.it  This test is not yet approved or cleared by the [place] FDA and has been authorized for detection and/or diagnosis of SARS-CoV-2 by FDA under an Emergency Use Authorization (EUA). This EUA will remain in effect (meaning this test can be used) for the duration of the COVID-19 declaration under Section 564(b)(1) of the Act, 21 U.S.C. section 360bbb-3(b)(1), unless the authorization is terminated or revoked.  Performed at [facility], [address], [place], [place] [address]    WBC 05/05/2022 6.1  4.0 - 10.5 K/uL Final   RBC 05/05/2022 5.86 (H)  4.22 - 5.81 MIL/uL Final   Hemoglobin 05/05/2022 12.7 (L)  13.0 - 17.0 g/dL Final   HCT 75/64/3329 36.8 (L)  39.0 - 52.0 % Final   MCV 05/05/2022 62.8 (L)  80.0 - 100.0 fL Final   MCH 05/05/2022 21.7 (L)  26.0 - 34.0 pg Final   MCHC 05/05/2022 34.5  30.0 - 36.0 g/dL Final   RDW 51/88/4166 17.8 (H)  11.5 - 15.5 % Final   Platelets 05/05/2022 200  150 - 400 K/uL Final   REPEATED TO VERIFY   nRBC 05/05/2022 0.0  0.0 - 0.2 % Final   Neutrophils Relative % 05/05/2022 38  % Final   Neutro Abs 05/05/2022 2.4  1.7 - 7.7 K/uL Final   Lymphocytes Relative 05/05/2022 54  % Final   Lymphs Abs 05/05/2022 3.3  0.7 - 4.0 K/uL Final   Monocytes Relative 05/05/2022 6  % Final   Monocytes Absolute 05/05/2022 0.4  0.1 - 1.0 K/uL Final   Eosinophils Relative 05/05/2022 1  % Final   Eosinophils Absolute 05/05/2022 0.0  0.0 - 0.5 K/uL Final   Basophils Relative 05/05/2022 1  % Final   Basophils Absolute 05/05/2022 0.0  0.0 - 0.1 K/uL Final   Immature Granulocytes 05/05/2022 0  % Final   Abs Immature Granulocytes 05/05/2022 0.01  0.00 - 0.07 K/uL Final   Performed at [facility], [address], [place], [place] [address]   Sodium 05/05/2022 137  135 - 145 mmol/L Final   Potassium 05/05/2022 3.7  3.5 - 5.1 mmol/L Final   Chloride 05/05/2022 105  98 - 111 mmol/L Final   CO2 05/05/2022 25  22 - 32 mmol/L Final  Glucose, Bld 05/05/2022 100 (H)  70 - 99 mg/dL Final   Glucose reference range  applies only to samples taken after fasting for at least 8 hours.   BUN 05/05/2022 9  6 - 20 mg/dL Final   Creatinine, Ser 05/05/2022 0.92  0.61 - 1.24 mg/dL Final   Calcium 74/25/9563 9.4  8.9 - 10.3 mg/dL Final   Total Protein 87/56/4332 6.9  6.5 - 8.1 g/dL Final   Albumin 95/18/8416 4.2  3.5 - 5.0 g/dL Final   AST 60/63/0160 16  15 - 41 U/L Final   ALT 05/05/2022 10  0 - 44 U/L Final   Alkaline Phosphatase 05/05/2022 50  38 - 126 U/L Final   Total Bilirubin 05/05/2022 0.7  0.3 - 1.2 mg/dL Final   GFR, Estimated 05/05/2022 >60  >60 mL/min Final   Comment: (NOTE) Calculated using the CKD-EPI Creatinine Equation (2021)    Anion gap 05/05/2022 7  5 - 15 Final   Performed at [facility], [address], [place], [place] [address]   Hgb A1c MFr Bld 05/05/2022 5.0  4.8 - 5.6 % Final   Comment: (NOTE)         Prediabetes: 5.7 - 6.4         Diabetes: >6.4         Glycemic control for adults with diabetes: <7.0    Mean Plasma Glucose 05/05/2022 97  mg/dL Final   Comment: (NOTE) Performed At: [facility] [address] [place], [place] [id number] Jolene Schimke MD RK:[id number]    Alcohol, Ethyl (B) 05/05/2022 <10  <10 mg/dL Final   Comment: (NOTE) Lowest detectable limit for serum alcohol is 10 mg/dL.  For medical purposes only. Performed at [facility], [address], [place], [place] [address]    Cholesterol 05/05/2022 101  0 - 200 mg/dL Final   Triglycerides 61/60/7371 34  <150 mg/dL Final   HDL 03/24/9484 58  >40 mg/dL Final   Total CHOL/HDL Ratio 05/05/2022 1.7  RATIO Final   VLDL 05/05/2022 7  0 - 40 mg/dL Final   LDL Cholesterol 05/05/2022 36  0 - 99 mg/dL Final   Comment:        Total Cholesterol/HDL:CHD Risk Coronary Heart Disease Risk Table                     Men   Women  1/2 Average Risk   3.4   3.3  Average Risk       5.0   4.4  2 X Average Risk   9.6   7.1  3 X Average Risk  23.4   11.0        Use the calculated Patient Ratio above  and the CHD Risk Table to determine the patient's CHD Risk.        ATP III CLASSIFICATION (LDL):  <100     mg/dL   Optimal  462-703  mg/dL   Near or Above                    Optimal  130-159  mg/dL   Borderline  500-938  mg/dL   High  >182     mg/dL   Very High Performed at [facility], [address] [place], [place] [address]    TSH 05/05/2022 0.394  0.350 - 4.500 uIU/mL Final   Comment: Performed by a 3rd Generation assay with a functional sensitivity of <=0.01 uIU/mL. Performed at  [facility], [address], [place], [place] [address]    POC Amphetamine UR 05/07/2022 None Detected  NONE DETECTED (Cut Off Level 1000 ng/mL) Final   POC Secobarbital (BAR) 05/07/2022 None Detected  NONE DETECTED (Cut Off Level 300 ng/mL) Final   POC Buprenorphine (BUP) 05/07/2022 None Detected  NONE DETECTED (Cut Off Level 10 ng/mL) Final   POC Oxazepam (BZO) 05/07/2022 None Detected  NONE DETECTED (Cut Off Level 300 ng/mL) Final   POC Cocaine UR 05/07/2022 None Detected  NONE DETECTED (Cut Off Level 300 ng/mL) Final   POC Methamphetamine UR 05/07/2022 None Detected  NONE DETECTED (Cut Off Level 1000 ng/mL) Final   POC Morphine 05/07/2022 None Detected  NONE DETECTED (Cut Off Level 300 ng/mL) Final   POC Methadone UR 05/07/2022 None Detected  NONE DETECTED (Cut Off Level 300 ng/mL) Final   POC Oxycodone UR 05/07/2022 None Detected  NONE DETECTED (Cut Off Level 100 ng/mL) Final   POC Marijuana UR 05/07/2022 Positive (A)  NONE DETECTED (Cut Off Level 50 ng/mL) Final  Admission on 04/06/2022, Discharged on 04/07/2022  Component Date Value Ref Range Status   Sodium 04/06/2022 141  135 - 145 mmol/L Final   Potassium 04/06/2022 4.5  3.5 - 5.1 mmol/L Final   Chloride 04/06/2022 107  98 - 111 mmol/L Final   CO2 04/06/2022 28  22 - 32 mmol/L Final   Glucose, Bld 04/06/2022 72  70 - 99 mg/dL Final   Glucose reference range applies only to samples taken after fasting for at least 8 hours.   BUN  04/06/2022 8  6 - 20 mg/dL Final   Creatinine, Ser 04/06/2022 0.91  0.61 - 1.24 mg/dL Final   Calcium 19/14/7829 9.1  8.9 - 10.3 mg/dL Final   Total Protein 56/21/3086 7.2  6.5 - 8.1 g/dL Final   Albumin 57/84/6962 4.1  3.5 - 5.0 g/dL Final   AST 95/28/4132 16  15 - 41 U/L Final   ALT 04/06/2022 13  0 - 44 U/L Final   Alkaline Phosphatase 04/06/2022 58  38 - 126 U/L Final   Total Bilirubin 04/06/2022 0.6  0.3 - 1.2 mg/dL Final   GFR, Estimated 04/06/2022 >60  >60 mL/min Final   Comment: (NOTE) Calculated using the CKD-EPI Creatinine Equation (2021)    Anion gap 04/06/2022 6  5 - 15 Final   Performed at [facility], [address] [place], [place] [address]   WBC 04/06/2022 6.2  4.0 - 10.5 K/uL Final   RBC 04/06/2022 5.71  4.22 - 5.81 MIL/uL Final   Hemoglobin 04/06/2022 12.6 (L)  13.0 - 17.0 g/dL Final   HCT 27/25/3664 37.4 (L)  39.0 - 52.0 % Final   MCV 04/06/2022 65.5 (L)  80.0 - 100.0 fL Final   MCH 04/06/2022 22.1 (L)  26.0 - 34.0 pg Final   MCHC 04/06/2022 33.7  30.0 - 36.0 g/dL Final   RDW 40/34/7425 18.8 (H)  11.5 - 15.5 % Final   Platelets 04/06/2022 232  150 - 400 K/uL Final   Comment: SPECIMEN CHECKED FOR CLOTS REPEATED TO VERIFY PLATELET COUNT CONFIRMED BY SMEAR    nRBC 04/06/2022 0.0  0.0 - 0.2 % Final   Performed at [facility], [address], [place], [place] [address]   Salicylate Lvl 04/06/2022 <7.0 (L)  7.0 - 30.0 mg/dL Final   Performed at [facility], [address], [place], [place] [address]   Acetaminophen (Tylenol), Serum 04/06/2022 <  10 (L)  10 - 30 ug/mL Final   Comment: (NOTE) Therapeutic concentrations vary significantly. A range of 10-30 ug/mL  may be an effective concentration for many patients. However, some  are best treated at concentrations outside of this range. Acetaminophen concentrations >150 ug/mL at 4 hours after ingestion  and >50 ug/mL at 12 hours after ingestion are often  associated with  toxic reactions.  Performed at [facility], [address], [place], [place] [address]    Alcohol, Ethyl (B) 04/06/2022 <10  <10 mg/dL Final   Comment: (NOTE) Lowest detectable limit for serum alcohol is 10 mg/dL.  For medical purposes only. Performed at [facility], [address], [place], [place] [address]    Opiates 04/06/2022 NONE DETECTED  NONE DETECTED Final   Cocaine 04/06/2022 NONE DETECTED  NONE DETECTED Final   Benzodiazepines 04/06/2022 NONE DETECTED  NONE DETECTED Final   Amphetamines 04/06/2022 NONE DETECTED  NONE DETECTED Final   Tetrahydrocannabinol 04/06/2022 POSITIVE (A)  NONE DETECTED Final   Barbiturates 04/06/2022 NONE DETECTED  NONE DETECTED Final   Comment: (NOTE) DRUG SCREEN FOR MEDICAL PURPOSES ONLY.  IF CONFIRMATION IS NEEDED FOR ANY PURPOSE, NOTIFY LAB WITHIN 5 DAYS.  LOWEST DETECTABLE LIMITS FOR URINE DRUG SCREEN Drug Class                     Cutoff (ng/mL) Amphetamine and metabolites    1000 Barbiturate and metabolites    200 Benzodiazepine                 200 Tricyclics and metabolites     300 Opiates and metabolites        300 Cocaine and metabolites        300 THC                            50 Performed at [facility], [address], [place], [place] [address]    SARS Coronavirus 2 by RT PCR 04/06/2022 NEGATIVE  NEGATIVE Final   Comment: (NOTE) SARS-CoV-2 target nucleic acids are NOT DETECTED.  The SARS-CoV-2 RNA is generally detectable in upper respiratory specimens during the acute phase of infection. The lowest concentration of SARS-CoV-2 viral copies this assay can detect is 138 copies/mL. A negative result does not preclude SARS-Cov-2 infection and should not be used as the sole basis for treatment or other patient management decisions. A negative result may occur with  improper specimen collection/handling, submission of specimen other than  nasopharyngeal swab, presence of viral mutation(s) within the areas targeted by this assay, and inadequate number of viral copies(<138 copies/mL). A negative result must be combined with clinical observations, patient history, and epidemiological information. The expected result is Negative.  Fact Sheet for Patients:  BloggerCourse.com  Fact Sheet for Healthcare Providers:  SeriousBroker.it  This test is no                          t yet approved or cleared by the [place] FDA and  has been authorized for detection and/or diagnosis of SARS-CoV-2 by FDA under an Emergency Use Authorization (EUA). This EUA will remain  in effect (meaning this test can be used) for the duration of the COVID-19 declaration under Section 564(b)(1) of the Act, 21 U.S.C.section 360bbb-3(b)(1), unless the authorization is terminated  or revoked sooner.       Influenza A  by PCR 04/06/2022 NEGATIVE  NEGATIVE Final   Influenza B by PCR 04/06/2022 NEGATIVE  NEGATIVE Final   Comment: (NOTE) The Xpert Xpress SARS-CoV-2/FLU/RSV plus assay is intended as an aid in the diagnosis of influenza from Nasopharyngeal swab specimens and should not be used as a sole basis for treatment. Nasal washings and aspirates are unacceptable for Xpert Xpress SARS-CoV-2/FLU/RSV testing.  Fact Sheet for Patients: BloggerCourse.com  Fact Sheet for Healthcare Providers: SeriousBroker.it  This test is not yet approved or cleared by the [place] FDA and has been authorized for detection and/or diagnosis of SARS-CoV-2 by FDA under an Emergency Use Authorization (EUA). This EUA will remain in effect (meaning this test can be used) for the duration of the COVID-19 declaration under Section 564(b)(1) of the Act, 21 U.S.C. section 360bbb-3(b)(1), unless the authorization is terminated or revoked.  Performed at [facility], [address] [place], [place] [address]     Blood Alcohol level:  Lab Results  Component Value Date   ETH <10 05/05/2022   ETH <10 04/06/2022    Metabolic Disorder Labs: Lab Results  Component Value Date   HGBA1C 5.0 05/05/2022   MPG 97 05/05/2022   No results found for: "PROLACTIN" Lab Results  Component Value Date   CHOL 101 05/05/2022   TRIG 34 05/05/2022   HDL 58 05/05/2022   CHOLHDL 1.7 05/05/2022   VLDL 7 05/05/2022   LDLCALC 36 05/05/2022    Therapeutic Lab Levels: No results found for: "LITHIUM" Lab Results  Component Value Date   VALPROATE 122 (H) 05/27/2021   No results found for: "CBMZ"  Physical Findings   PHQ2-9    Flowsheet Row Office Visit from 11/13/2020 in [facility] at [facility] ED from 09/01/2020 in [facility]  PHQ-2 Total Score 0 2  PHQ-9 Total Score 0 3      Flowsheet Row ED from [facility]/05/2022 in [facility] ED from 04/06/2022 in [facility] from 07/05/2021 in [facility] EMERGENCY DEPARTMENT  C-SSRS RISK CATEGORY No Risk No Risk No Risk        Musculoskeletal  Strength & Muscle Tone: within normal limits Gait & Station: normal Patient leans: N/A  Psychiatric Specialty Exam  Presentation  General Appearance: Casual  Eye Contact:Fleeting  Speech:Clear and Coherent  Speech Volume:Normal  Handedness: NA  Mood and Affect  Mood:Euthymic  Affect:Constricted   Thought Process  Thought Processes:Coherent  Descriptions of Associations:Intact  Orientation:Full (Time, Place and Person)  Thought Content:Scattered  Diagnosis of Schizophrenia or Schizoaffective disorder in past: No  Duration of Psychotic Symptoms: Greater than six months   Hallucinations:Hallucinations: -- (seen talking with unseen others)  Ideas of Reference:None  Suicidal Thoughts:Suicidal Thoughts: No  Homicidal  Thoughts:Homicidal Thoughts: No   Sensorium  Memory:Immediate Good; Recent Good; Remote Good  Judgment:Poor  Insight:Poor   Executive Functions  Concentration:Fair  Attention Span:Fair  Recall:Fair  Fund of Knowledge:Fair  Language:Fair   Psychomotor Activity  Psychomotor Activity:Psychomotor Activity: Normal   Assets  Assets:Communication Skills; Desire for Improvement   Sleep  Sleep:Sleep: Fair    Physical Exam  Physical Exam Constitutional:      Appearance: the patient is not toxic-appearing.  Pulmonary:     Effort: Pulmonary effort is normal.  Neurological:     General: No focal deficit present.     Mental Status: the patient is alert and oriented to person, place, and time.   Review  of Systems  Respiratory:  Negative for shortness of breath.   Cardiovascular:  Negative for chest pain.  Gastrointestinal:  Negative for abdominal pain, constipation, diarrhea, nausea and vomiting.  Neurological:  Negative for headaches.   Blood pressure 110/65, pulse (!) 51, temperature 98 F (36.7 C), temperature source Oral, resp. rate 18, SpO2 100 %. There is no height or weight on file to calculate BMI.  Treatment Plan Summary: Daily contact with patient to assess and evaluate symptoms and progress in treatment and Medication management  Status: Involuntary admission to the observation unit Dispo: LCSW has faxed out patient  Psychosis Patient denies having any home medications, none found -Continue Zyprexa 10 mg BID for psychosis -Agitation protocol  Labs Hgb of 12.7 UDS: not yet collected   Carlyn Reichert, MD 05/07/2022 9:05 AM

## 2022-05-08 MED ORDER — OLANZAPINE 10 MG PO TABS: ORAL_TABLET | ORAL | 0 refills | Status: DC

## 2022-05-08 NOTE — ED Provider Notes (Signed)
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  Of note, the patient was recently admitted to [facility] in June 2023 for 5 days after presenting with psychotic behavior.  He was stabilized with Zyprexa.  The patient presented under involuntary commitment, with the forms being filled out by his mother, Lovenia Kim [phone].  In the IVC documentation she reports that the patient has been not taking his medication, smoking marijuana, and has appeared psychotic, talking with other people, stating that he is God, and destroying property and threatening his mother. On admission assessment, the patient was noted to be psychotic with word salad and the patient was responding to internal stimuli.    Stay Summary:  The patient was started on Zyprexa which was titrated to 10 mg twice daily.  The patient had a positive response to this medication.  On the day of discharge, the patient exhibited a linear and logical thought process with appropriate affect.  He was future oriented and was able to state concrete goals for the future.  On the day of discharge, the patient denied suicidal or homicidal thoughts.  He denied experiencing auditory or visual hallucinations.  The patient's mother was contacted at multiple points during the hospitalization.  She is aware of the risk of violence that the patient poses to her.  She stated on multiple occasions that she will not accept the patient back into her home.  She states that she is currently working on a restraining order.   Total Time spent with  patient: 15 minutes  Past Psychiatric History: as above Past Medical History:  Past Medical History:  Diagnosis Date   Sickle cell anemia (HCC) 09/25/2020   Phreesia 11/10/2020   Tourette syndrome    No past surgical history on file. Family History:  Family History  Problem Relation Age of Onset   Scoliosis Mother    Family Psychiatric History: per H and P Social History:  Social History   Substance and Sexual Activity  Alcohol Use Never     Social History   Substance and Sexual Activity  Drug Use Never   Types: Marijuana    Social History   Socioeconomic History   Marital status: Single    Spouse name: Not on file   Number of children: Not on file   Years of education: Not on file   Highest education level: Not on file  Occupational History   Not on file  Tobacco Use   Smoking status: Never   Smokeless tobacco: Never  Vaping Use   Vaping Use: Former  Substance and Sexual Activity   Alcohol use: Never   Drug use: Never    Types: Marijuana   Sexual activity: Not Currently    Birth control/protection: None  Other Topics Concern   Not on file  Social History Narrative   ** Merged History Encounter **       Social Determinants of Health   Financial Resource Strain: Not on file  Food Insecurity: Not on file  Transportation Needs: Not on file  Physical Activity: Not on file  Stress: Not on file  Social Connections: Not  on file   SDOH:  SDOH Screenings   Alcohol Screen: Not on file  Depression (PHQ2-9): Low Risk  (11/13/2020)   Depression (PHQ2-9)    PHQ-2 Score: 0  Financial Resource Strain: Not on file  Food Insecurity: Not on file  Housing: Not on file  Physical Activity: Not on file  Social Connections: Not on file  Stress: Not on file  Tobacco Use: Low Risk  (04/06/2022)   Patient History    Smoking Tobacco Use: Never    Smokeless Tobacco Use: Never    Passive Exposure: Not on file  Transportation Needs: Not on file    Tobacco Cessation:   A prescription for an FDA-approved tobacco cessation medication provided at discharge  Current Medications:  Current Facility-Administered Medications  Medication Dose Route Frequency Provider Last Rate Last Admin   acetaminophen (TYLENOL) tablet 650 mg  650 mg Oral Q6H PRN Sindy Guadeloupe, NP       alum & mag hydroxide-simeth (MAALOX/MYLANTA) 200-200-20 MG/5ML suspension 30 mL  30 mL Oral Q4H PRN Sindy Guadeloupe, NP       risperiDONE (RISPERDAL M-TABS) disintegrating tablet 2 mg  2 mg Oral Q8H PRN Sindy Guadeloupe, NP   2 mg at 05/06/22 2130   And   LORazepam (ATIVAN) tablet 1 mg  1 mg Oral PRN Sindy Guadeloupe, NP       And   ziprasidone (GEODON) injection 20 mg  20 mg Intramuscular PRN Sindy Guadeloupe, NP       magnesium hydroxide (MILK OF MAGNESIA) suspension 30 mL  30 mL Oral Daily PRN Sindy Guadeloupe, NP       OLANZapine (ZYPREXA) tablet 10 mg  10 mg Oral QHS Sindy Guadeloupe, NP   10 mg at 05/06/22 2129   OLANZapine (ZYPREXA) tablet 10 mg  10 mg Oral Daily Carlyn Reichert, MD   10 mg at 05/07/22 0916   ondansetron (ZOFRAN-ODT) disintegrating tablet 4 mg  4 mg Oral Q8H PRN Sindy Guadeloupe, NP       polyethylene glycol (MIRALAX / GLYCOLAX) packet 17 g  17 g Oral BID Sindy Guadeloupe, NP   17 g at 05/07/22 0911   Current Outpatient Medications  Medication Sig Dispense Refill   acetaminophen (TYLENOL) 500 MG tablet Take 1,000 mg by mouth every 6 (six) hours as needed for headache.      PTA Medications: (Not in a hospital admission)      11/13/2020    8:39 AM 09/01/2020    9:29 AM  Depression screen PHQ 2/9  Decreased Interest 0 1  Down, Depressed, Hopeless 0 1  PHQ - 2 Score 0 2  Altered sleeping 0 0  Tired, decreased energy 0 1  Change in appetite 0 0  Feeling bad or failure about yourself  0 0  Trouble concentrating 0 0  Moving slowly or fidgety/restless 0 0  Suicidal thoughts 0 0  PHQ-9 Score 0 3  Difficult doing work/chores Not difficult at all     Flowsheet Row ED from 05/05/2022 in  [facility] ED from 04/06/2022 in [facility] ED from 07/05/2021 in [facility] EMERGENCY DEPARTMENT  C-SSRS RISK CATEGORY No Risk No Risk No Risk       Musculoskeletal  Strength & Muscle Tone: within normal limits Gait & Station: normal Patient leans: N/A  Psychiatric Specialty Exam  Presentation  General Appearance: Casual  Eye Contact:Fair  Speech:Clear and Coherent  Speech Volume:Normal   Mood and Affect  Mood:Euthymic  Affect:Congruent   Thought Process  Thought Processes:Coherent; Linear; Goal Directed  Descriptions of Associations:Intact  Orientation:Full (Time, Place and Person)  Thought Content:Logical  Diagnosis of Schizophrenia or Schizoaffective disorder in past: No  Duration of Psychotic Symptoms: Greater than six months   Hallucinations:Hallucinations: None  Ideas of Reference:None  Suicidal Thoughts:Suicidal Thoughts: No  Homicidal Thoughts:Homicidal Thoughts: No   Sensorium  Memory:Immediate Good; Recent Good  Judgment:Fair  Insight:Fair   Executive Functions  Concentration:Fair  Attention Span:Fair  Recall:Fair  Fund of Knowledge:Fair  Language:Fair   Psychomotor Activity  Psychomotor Activity:Psychomotor Activity: Normal   Assets  Assets:Communication Skills; Desire for Improvement   Sleep  Sleep:Sleep: Fair   No data recorded  Physical Exam  Physical Exam Constitutional:      Appearance: the patient is not toxic-appearing.  Pulmonary:     Effort: Pulmonary effort is normal.  Neurological:     General: No focal deficit present.     Mental Status: the patient is alert and oriented to person, place, and time.   Review of Systems  Respiratory:  Negative for shortness of breath.   Cardiovascular:  Negative for chest pain.  Gastrointestinal:  Negative for abdominal pain, constipation, diarrhea, nausea and vomiting.  Neurological:   Negative for headaches.   Blood pressure 125/71, pulse 93, temperature (!) 97.4 F (36.3 C), temperature source Oral, resp. rate 18, SpO2 100 %. There is no height or weight on file to calculate BMI.  Demographic Factors:  Male and Low socioeconomic status  Loss Factors: NA  Historical Factors: NA  Risk Reduction Factors:   Positive social support and Positive coping skills or problem solving skills  Continued Clinical Symptoms:  Psychosis (resolved)  Cognitive Features That Contribute To Risk:  None    Suicide Risk:  Mild: The patient presented with no identifiable suicidal ideation with loss of executive function secondary to psychosis.  His psychosis is resolved and presently he is future oriented, with a linear logical thought process.  Plan Of Care/Follow-up recommendations:  Activity: as tolerated  Diet: heart healthy  Other: -Follow-up with your outpatient psychiatric provider - information provided in the discharge instructions.   -Take your psychiatric medications as prescribed at discharge - instructions are provided to you in the discharge paperwork.  -Follow-up with outpatient primary care doctor and other specialists -for management of preventative medicine and chronic medical disease.   -Recommend abstinence from alcohol, tobacco, and other illicit drug use at discharge.   -If your psychiatric symptoms recur, worsen, or if you have side effects to your psychiatric medications, call your outpatient psychiatric provider, 911, [phone] or go to the nearest emergency department.   Disposition: self care  Carlyn Reichert, MD 05/08/2022, 1:34 PM

## 2022-05-08 NOTE — ED Notes (Signed)
Pt refused scheduled meds x2 this morning. Requested a PB & J sandwich. Informed Pt that it was snack time not lunch time. Offered a snack, pt accepted. Safety maintained and will continue to monitor.

## 2022-05-08 NOTE — ED Notes (Signed)
Discharge instructions provided and Pt stated understanding. Pt alert, orient and ambulatory prior to d/c from facility. Personal belongings returned from locker number 21. Bus passes provided. Pt escorted to the front lobby to d/c from facility. Safety maintained.

## 2022-05-08 NOTE — ED Notes (Signed)
Patient slept on and off through the night. Patient was restless when awake constantly asking what is the time. Walking back and forth on the unit. Patient requested shower supplies...showered and went back to bed. Patient currently sleeping soundly. No acute distress noted. Will continue to monitor for safety.

## 2022-05-15 ENCOUNTER — Other Ambulatory Visit: Payer: Self-pay

## 2022-05-15 ENCOUNTER — Emergency Department (HOSPITAL_COMMUNITY)
Admission: EM | Admit: 2022-05-15 | Discharge: 2022-05-15 | Disposition: A | Discharge status: Home / Self Care | Payer: Medicaid Other | Attending: Emergency Medicine | Admitting: Emergency Medicine

## 2022-05-15 ENCOUNTER — Ambulatory Visit (HOSPITAL_COMMUNITY)
Admission: EM | Admit: 2022-05-15 | Discharge: 2022-05-16 | Disposition: A | Discharge status: Home / Self Care | Payer: Medicaid Other | Source: Home / Self Care

## 2022-05-15 ENCOUNTER — Encounter (HOSPITAL_COMMUNITY): Payer: Self-pay

## 2022-05-15 ENCOUNTER — Emergency Department (HOSPITAL_COMMUNITY): Payer: Medicaid Other

## 2022-05-15 ENCOUNTER — Ambulatory Visit (HOSPITAL_COMMUNITY)
Admission: AD | Admit: 2022-05-15 | Discharge: 2022-05-15 | Disposition: A | Discharge status: Home / Self Care | Payer: Medicaid Other | Attending: Psychiatry | Admitting: Psychiatry

## 2022-05-15 DIAGNOSIS — R451 Restlessness and agitation: Secondary | ICD-10-CM | POA: Diagnosis present

## 2022-05-15 DIAGNOSIS — R45851 Suicidal ideations: Secondary | ICD-10-CM | POA: Insufficient documentation

## 2022-05-15 DIAGNOSIS — M79642 Pain in left hand: Secondary | ICD-10-CM | POA: Diagnosis not present

## 2022-05-15 DIAGNOSIS — Z91148 Patient's other noncompliance with medication regimen for other reason: Secondary | ICD-10-CM | POA: Insufficient documentation

## 2022-05-15 DIAGNOSIS — F1210 Cannabis abuse, uncomplicated: Secondary | ICD-10-CM | POA: Insufficient documentation

## 2022-05-15 DIAGNOSIS — F3164 Bipolar disorder, current episode mixed, severe, with psychotic features: Secondary | ICD-10-CM | POA: Insufficient documentation

## 2022-05-15 DIAGNOSIS — R440 Auditory hallucinations: Secondary | ICD-10-CM | POA: Insufficient documentation

## 2022-05-15 DIAGNOSIS — F29 Unspecified psychosis not due to a substance or known physiological condition: Secondary | ICD-10-CM | POA: Insufficient documentation

## 2022-05-15 DIAGNOSIS — F333 Major depressive disorder, recurrent, severe with psychotic symptoms: Secondary | ICD-10-CM

## 2022-05-15 DIAGNOSIS — Z5900 Homelessness unspecified: Secondary | ICD-10-CM | POA: Insufficient documentation

## 2022-05-15 DIAGNOSIS — F1290 Cannabis use, unspecified, uncomplicated: Secondary | ICD-10-CM

## 2022-05-15 DIAGNOSIS — F1490 Cocaine use, unspecified, uncomplicated: Secondary | ICD-10-CM | POA: Diagnosis not present

## 2022-05-15 DIAGNOSIS — F39 Unspecified mood [affective] disorder: Secondary | ICD-10-CM | POA: Diagnosis not present

## 2022-05-15 DIAGNOSIS — Z20822 Contact with and (suspected) exposure to covid-19: Secondary | ICD-10-CM | POA: Insufficient documentation

## 2022-05-15 LAB — CBC WITH DIFFERENTIAL/PLATELET
Abs Immature Granulocytes: 0.01 10*3/uL (ref 0.00–0.07)
Basophils Absolute: 0 10*3/uL (ref 0.0–0.1)
Basophils Relative: 1 %
Eosinophils Absolute: 0 10*3/uL (ref 0.0–0.5)
Eosinophils Relative: 0 %
HCT: 38.5 % — ABNORMAL LOW (ref 39.0–52.0)
Hemoglobin: 13 g/dL (ref 13.0–17.0)
Immature Granulocytes: 0 %
Lymphocytes Relative: 28 %
Lymphs Abs: 1.5 10*3/uL (ref 0.7–4.0)
MCH: 21.9 pg — ABNORMAL LOW (ref 26.0–34.0)
MCHC: 33.8 g/dL (ref 30.0–36.0)
MCV: 64.8 fL — ABNORMAL LOW (ref 80.0–100.0)
Monocytes Absolute: 0.2 10*3/uL (ref 0.1–1.0)
Monocytes Relative: 4 %
Neutro Abs: 3.8 10*3/uL (ref 1.7–7.7)
Neutrophils Relative %: 67 %
Platelets: 220 10*3/uL (ref 150–400)
RBC: 5.94 MIL/uL — ABNORMAL HIGH (ref 4.22–5.81)
RDW: 18.1 % — ABNORMAL HIGH (ref 11.5–15.5)
WBC: 5.6 10*3/uL (ref 4.0–10.5)
nRBC: 0 % (ref 0.0–0.2)

## 2022-05-15 LAB — COMPREHENSIVE METABOLIC PANEL
ALT: 11 U/L (ref 0–44)
AST: 18 U/L (ref 15–41)
Albumin: 4.4 g/dL (ref 3.5–5.0)
Alkaline Phosphatase: 52 U/L (ref 38–126)
Anion gap: 7 (ref 5–15)
BUN: 8 mg/dL (ref 6–20)
CO2: 24 mmol/L (ref 22–32)
Calcium: 9.4 mg/dL (ref 8.9–10.3)
Chloride: 107 mmol/L (ref 98–111)
Creatinine, Ser: 0.91 mg/dL (ref 0.61–1.24)
GFR, Estimated: >60 mL/min (ref >60)
Glucose, Bld: 104 mg/dL — ABNORMAL HIGH (ref 70–99)
Potassium: 3.9 mmol/L (ref 3.5–5.1)
Sodium: 138 mmol/L (ref 135–145)
Total Bilirubin: 0.9 mg/dL (ref 0.3–1.2)
Total Protein: 7.6 g/dL (ref 6.5–8.1)

## 2022-05-15 LAB — RAPID URINE DRUG SCREEN, HOSP PERFORMED
Amphetamines: NOT DETECTED
Barbiturates: NOT DETECTED
Benzodiazepines: NOT DETECTED
Cocaine: NOT DETECTED
Opiates: NOT DETECTED
Tetrahydrocannabinol: POSITIVE — AB

## 2022-05-15 LAB — RETICULOCYTES
Immature Retic Fract: 7.1 % (ref 2.3–15.9)
RBC.: 5.84 MIL/uL — ABNORMAL HIGH (ref 4.22–5.81)
Retic Count, Absolute: 42 10*3/uL (ref 19.0–186.0)
Retic Ct Pct: 0.7 % (ref 0.4–3.1)

## 2022-05-15 MED ORDER — ALUM & MAG HYDROXIDE-SIMETH 200-200-20 MG/5ML PO SUSP: 30.0000 mL | ORAL | Status: DC | PRN

## 2022-05-15 MED ORDER — HYDROXYZINE HCL 25 MG PO TABS: 25.0000 mg | ORAL_TABLET | Freq: Three times a day (TID) | ORAL | Status: DC | PRN

## 2022-05-15 MED ORDER — MAGNESIUM HYDROXIDE 400 MG/5ML PO SUSP: 30.0000 mL | Freq: Every day | ORAL | Status: DC | PRN

## 2022-05-15 MED ORDER — ACETAMINOPHEN 325 MG PO TABS
650.0000 mg | ORAL_TABLET | Freq: Four times a day (QID) | ORAL | Status: DC | PRN
  Administered 2022-05-16 (×2): 650 mg via ORAL
  Filled 2022-05-15 (×2): qty 2

## 2022-05-15 MED ORDER — IBUPROFEN 800 MG PO TABS
800.0000 mg | ORAL_TABLET | Freq: Once | ORAL | Status: AC
  Administered 2022-05-15: 800 mg via ORAL
  Filled 2022-05-15: qty 1

## 2022-05-15 MED ORDER — TRAZODONE HCL 50 MG PO TABS: 50.0000 mg | ORAL_TABLET | Freq: Every evening | ORAL | Status: DC | PRN

## 2022-05-15 MED ORDER — OLANZAPINE 5 MG PO TABS
5.0000 mg | ORAL_TABLET | Freq: Every day | ORAL | Status: DC
  Administered 2022-05-15: 5 mg via ORAL
  Filled 2022-05-15: qty 1

## 2022-05-15 NOTE — BH Assessment (Addendum)
[facility] Assessment Progress Note   Per Barbara Cower, this voluntary pt does not require psychiatric hospitalization at this time.  Pt is psychiatrically cleared.  Discharge instructions include referral information for [facility], along with information for area supportive services for the homeless.  EDP Arturo Morton, DO and Bethann Berkshire, MD, as well as pt's nurse, Nash Dimmer, have been notified.  Doylene Canning, MA Triage Specialist [phone]
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
## 2022-05-15 NOTE — ED Notes (Signed)
Pt items have been removed and placed behind triage area. Pt has one pt belongings bag with a pair of cloth, shoes, and cell phone.
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
## 2022-05-15 NOTE — ED Provider Notes (Signed)
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 ingestion  and >50 ug/mL  at 12 hours after ingestion are often associated with  toxic reactions.  Performed at [facility], [address], [place], [place] [address]    Alcohol, Ethyl (B) 04/06/2022 <10  <10 mg/dL Final   Comment: (NOTE) Lowest detectable limit for serum alcohol is 10 mg/dL.  For medical purposes only. Performed at [facility], [address], [place], [place] [address]    Opiates 04/06/2022 NONE DETECTED  NONE DETECTED Final   Cocaine 04/06/2022 NONE DETECTED  NONE DETECTED Final   Benzodiazepines 04/06/2022 NONE DETECTED  NONE DETECTED Final   Amphetamines 04/06/2022 NONE DETECTED  NONE DETECTED Final   Tetrahydrocannabinol 04/06/2022 POSITIVE (A)  NONE DETECTED Final   Barbiturates 04/06/2022 NONE DETECTED  NONE DETECTED Final   Comment: (NOTE) DRUG SCREEN FOR MEDICAL PURPOSES ONLY.  IF CONFIRMATION IS NEEDED FOR ANY PURPOSE, NOTIFY LAB WITHIN 5 DAYS.  LOWEST DETECTABLE LIMITS FOR URINE DRUG SCREEN Drug Class                     Cutoff (ng/mL) Amphetamine and metabolites    1000 Barbiturate and metabolites    200 Benzodiazepine                 200 Tricyclics and metabolites     300 Opiates and metabolites        300 Cocaine and metabolites        300 THC                            50 Performed at [facility], [address] [place], [place] [address]    SARS Coronavirus 2 by RT PCR 04/06/2022 NEGATIVE  NEGATIVE Final   Comment: (NOTE) SARS-CoV-2 target nucleic acids are NOT DETECTED.  The SARS-CoV-2 RNA is generally detectable in upper respiratory specimens during the acute phase of infection. The lowest concentration of SARS-CoV-2 viral copies this assay can detect is 138 copies/mL. A negative result does not preclude SARS-Cov-2 infection and should not be used as the sole basis for treatment or other patient management decisions. A negative result may occur with  improper specimen collection/handling,  submission of specimen other than nasopharyngeal swab, presence of viral mutation(s) within the areas targeted by this assay, and inadequate number of viral copies(<138 copies/mL). A negative result must be combined with clinical observations, patient history, and epidemiological information. The expected result is Negative.  Fact Sheet for Patients:  BloggerCourse.com  Fact Sheet for Healthcare Providers:  SeriousBroker.it  This test is no                          t yet approved or cleared by the [place] FDA and  has been authorized for detection and/or diagnosis of SARS-CoV-2 by FDA under an Emergency Use Authorization (EUA). This EUA will remain  in effect (meaning this test can be used) for the duration of the COVID-19 declaration under Section 564(b)(1) of the Act, 21  U.S.C.section 360bbb-3(b)(1), unless the authorization is terminated  or revoked sooner.       Influenza A by PCR 04/06/2022 NEGATIVE  NEGATIVE Final   Influenza B by PCR 04/06/2022 NEGATIVE  NEGATIVE Final   Comment: (NOTE) The Xpert Xpress SARS-CoV-2/FLU/RSV plus assay is intended as an aid in the diagnosis of influenza from Nasopharyngeal swab specimens and should not be used as a sole basis for treatment. Nasal washings and aspirates are unacceptable for Xpert Xpress SARS-CoV-2/FLU/RSV testing.  Fact Sheet for Patients: BloggerCourse.com  Fact Sheet for Healthcare Providers: SeriousBroker.it  This test is not yet approved or cleared by the [place] FDA and has been authorized for detection and/or diagnosis of SARS-CoV-2 by FDA under an Emergency Use Authorization (EUA). This EUA will remain in effect (meaning this test can be used) for the duration of the COVID-19 declaration under Section 564(b)(1) of the Act, 21 U.S.C. section 360bbb-3(b)(1), unless the authorization is terminated  or revoked.  Performed at [facility], [address], [place], [place] [address]     Allergies: Patient has no known allergies.  PTA Medications: (Not in a hospital admission)  Prior to Admission medications   Medication Sig Start Date End Date Taking? Authorizing Provider  acetaminophen (TYLENOL) 500 MG tablet Take 1,000 mg by mouth every 6 (six) hours as needed for headache.    [provider]  OLANZapine (ZYPREXA) 10 MG tablet Take one tablet (10 mg) two times daily 05/09/22   Carlyn Reichert, MD   Medical Decision Making  Recommend overnight admission to continuous observation unit for safety monitoring and re-eval in the a.m. Lab Orders         Resp Panel by RT-PCR (Flu A&B, Covid) Anterior Nasal Swab         POCT Urine Drug Screen - (I-Screen)      EKG  Medications initiated this encounter -Olanzapine 5 mg PO Qhs mood stabilization.  Other PRNs -Tylenol 650 mg PO every 6 hours as needed mild pain -Maalox 30 mL PO every 4 hours as needed indigestion -Atarax 25 mg p.o. 3 times daily as needed anxiety -MOM 30 mL PO daily as needed mild constipation -Trazodone 50 mg PO nightly as needed sleep   Recommendations  Based on my evaluation the patient does not appear to have an emergency medical condition.  Disp-Pt admitted to continues observation unit overnight, re-eval in am.   Mancel Bale, NP 05/16/22  1:50 AM
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
## 2022-05-15 NOTE — Discharge Instructions (Addendum)
For your behavioral health needs you are advised to follow up with [facility] at your earliest opportunity:      [facility]      [address], SECOND FLOOR      [place], [place] [address]      [phone]      They offer psychiatry/medication management and therapy.  New patients are seen in their walk-in clinic.  Walk-in hours are Monday, Wednesday, Thursday and Friday from 8:00 am - 11:00 am for psychiatry, and Monday and Wednesday from 8:00 am - 11:00 am for therapy.  Walk-in patients are seen on a first come, first served basis, so try to arrive as early as possible for the best chance of being seen the same day.  BE SURE TO TAKE THE ELEVATOR TO THE SECOND FLOOR.  Please note that to be eligible for services you must bring an ID or a piece of mail with your name and a [facility] address.  For your shelter needs, contact the following service providers:       [facility] (operated by [facility])      [address] [place], [place] [address]      [phone]       [facility]      [address]      [place], [place] [address]      [phone]  For day shelter and other supportive services for the homeless, contact the [facility]):       [facility]      [address]      [place], [place] [address]      [phone]  For transitional housing, contact one of the following agencies.  They provide longer term housing than a shelter, but there is an application process:       [facility]iday representative of [employer] [facility]      [address], [place] [address]      [phone]    ________________________________________  Discharge recommendations:  Patient is to take medications as prescribed. Please see information for follow-up appointment with psychiatry and therapy. Please follow up with your primary care provider for all medical  related needs.   Therapy: We recommend that patient participate in individual therapy to address mental health concerns.  Medications: The patient is to contact a medical professional and/or outpatient provider to address any new side effects that develop. Patient should update outpatient providers of any new medications and/or medication changes.   Atypical antipsychotics: If you are prescribed an atypical antipsychotic, it is recommended that your height, weight, BMI, blood pressure, fasting lipid panel, and fasting blood sugar be monitored by your outpatient providers.  Safety:  The patient should abstain from use of illicit substances/drugs and abuse of any medications. If symptoms worsen or do not continue to improve or if the patient becomes actively suicidal or homicidal then it is recommended that the patient return to the closest hospital emergency department, the [facility], or call 911 for further evaluation and treatment. National Suicide Prevention Lifeline 1-800-SUICIDE or (530) 133-5525.  About 988 988 offers 24/7 access to trained crisis counselors who can help people experiencing mental health-related distress. People can call or text 988 or chat 988lifeline.org for themselves or if they are worried about a loved  one who may need crisis support.  Crisis Mobile: [facility]:                     [phone] (for crisis response 24 hours a day) [facility] Hotline:                                            [phone]
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
## 2022-05-16 ENCOUNTER — Emergency Department (HOSPITAL_COMMUNITY)
Admission: EM | Admit: 2022-05-16 | Discharge: 2022-05-17 | Disposition: A | Discharge status: Home / Self Care | Payer: Medicaid Other | Attending: Emergency Medicine | Admitting: Emergency Medicine

## 2022-05-16 ENCOUNTER — Other Ambulatory Visit: Payer: Self-pay

## 2022-05-16 ENCOUNTER — Encounter (HOSPITAL_COMMUNITY): Payer: Self-pay

## 2022-05-16 DIAGNOSIS — W228XXA Striking against or struck by other objects, initial encounter: Secondary | ICD-10-CM | POA: Insufficient documentation

## 2022-05-16 DIAGNOSIS — M25532 Pain in left wrist: Secondary | ICD-10-CM | POA: Insufficient documentation

## 2022-05-16 DIAGNOSIS — S6992XA Unspecified injury of left wrist, hand and finger(s), initial encounter: Secondary | ICD-10-CM | POA: Diagnosis present

## 2022-05-16 DIAGNOSIS — F1490 Cocaine use, unspecified, uncomplicated: Secondary | ICD-10-CM | POA: Diagnosis not present

## 2022-05-16 DIAGNOSIS — S6992XD Unspecified injury of left wrist, hand and finger(s), subsequent encounter: Secondary | ICD-10-CM

## 2022-05-16 DIAGNOSIS — F39 Unspecified mood [affective] disorder: Secondary | ICD-10-CM | POA: Diagnosis not present

## 2022-05-16 DIAGNOSIS — M79642 Pain in left hand: Secondary | ICD-10-CM | POA: Diagnosis not present

## 2022-05-16 DIAGNOSIS — R45851 Suicidal ideations: Secondary | ICD-10-CM | POA: Diagnosis not present

## 2022-05-16 LAB — POCT URINE DRUG SCREEN - MANUAL ENTRY (I-SCREEN)
POC Amphetamine UR: NOT DETECTED
POC Buprenorphine (BUP): NOT DETECTED
POC Cocaine UR: NOT DETECTED
POC Marijuana UR: POSITIVE — AB
POC Methadone UR: NOT DETECTED
POC Methamphetamine UR: NOT DETECTED
POC Morphine: NOT DETECTED
POC Oxazepam (BZO): NOT DETECTED
POC Oxycodone UR: NOT DETECTED
POC Secobarbital (BAR): NOT DETECTED

## 2022-05-16 LAB — RESP PANEL BY RT-PCR (FLU A&B, COVID) ARPGX2
Influenza A by PCR: NEGATIVE
Influenza B by PCR: NEGATIVE
SARS Coronavirus 2 by RT PCR: NEGATIVE

## 2022-05-16 MED ORDER — OLANZAPINE 5 MG PO TABS
5.0000 mg | ORAL_TABLET | Freq: Two times a day (BID) | ORAL | Status: DC
  Administered 2022-05-16: 5 mg via ORAL
  Filled 2022-05-16: qty 1

## 2022-05-16 NOTE — ED Triage Notes (Signed)
Pt c/o SI x3 days. Pt denies method but is having thoughts of ending his life. Pt also c/o hallucinations off and on x2-3 years. Pt has hx of bipolar and is not always compliant with his medications. Pt easily distracted and would start to speak to himself and not answer RN's questions. Pt told himself that "its not right", "he didn't sleep with my mother".

## 2022-05-16 NOTE — ED Provider Notes (Signed)
FBC/OBS ASAP Discharge Summary  Date and Time: 05/16/2022 8:15 AM  Name: Cody Garrett  MRN:  [id number]   Discharge Diagnoses:  Final diagnoses:  Bipolar disorder, current episode mixed, severe, with psychotic features (HCC)  Homelessness  Noncompliance with medications  Marijuana abuse, continuous  Auditory hallucinations    Subjective: Patient seen and evaluated face-to-face by this provider, chart reviewed and case discussed with Dr. Lucianne Muss. On evaluation, patient is alert and oriented x 4. His thought process is linear and speech is clear and coherent at a moderate tone. His mood is dysphoric and affect is congruent. Patient denies SI/HI. Patient endorses AVH which she describes as seeing shadows of a wolfs and dogs and hearing voices of different people. He states that he always sees things and hears voices. There is no objective evidence that the patient is currently responding to internal or external stimuli. Patient reports fair sleep. Patient reports a fair appetite. He states that he has been experiencing mood swings that he describes as feeling chilled and the easily irritated and mad. He states that he is able to return back home but she wants him to go a long term hospital first. Patient was recently discharged from [facility] psychiatric unit in June 2023, for 5 days for psychotic behavior. At this time, patient does not meet criteria for inpatient psychiatric treatment and was advised that the will need to follow up here at the [facility] for medication management and therapy. I discussed with the patient the importance of medication compliance to help reduce symptoms. I discussed with the patient local shelter and mission housing options in the area. Patient verbally contracts for safety and is agreeable to shelter options. Patient has no access to weapons, including guns.   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 Patient presented voluntarily to [facility] as a walk-in due to [facility] with plan to drown himself or slit his wrists and go peacefully to sleep.  Per chart review,Patient was seen 2x at [facility] today 05/15/22 and psych cleared with instructions to follow up with outpatient mental health services".   Patient then presented to [facility] from [facility] voluntarily as a walk-in with complaint of mood swings and requesting information on homeless shelters and a bus pass. Patient was provided information as requested and discharged from the [facility], patient then came to the [facility].    Patient was admitted to the [facility] behavioral health urgent care continuous assessment unit for overnight observation and safety. Labs obtained includes: COVID, EKG, and UDS. UDS positive for THC. Patient reports smoking "weed" everyday. He reports "rarely" drinking alcohol. Patient was restarted back on home medication olanzapine 5 mg nightly. Olanzapine was increased to 5 mg po twice daily on 05/16/2022. Patient will continue home medication olanzapine 10 mg p.o. twice daily as previously prescribed on 05/08/2022 upon discharge here at the [facility] Patient has been observed on the unit without any disruptive, aggressive, self harm, or psychotic behaviors. Patient has denied SI/HI. Patient does not pose an immediate risk to himself or others and is not actively psychotic at this time. Plan is to discharge patient with recommendations to follow up with outpatient psychiatric services for medication management and therapy here at the [facility]. I contacted the patient's mother Cody Garrett [phone], per patient's request to see if the patient is allowed to return back home today. Ms. Chilton Si asked if the  patient could be given an injection. I explained to the patient's mother that the olanzapine does not come in an injection form and that the patient would need to  follow up here at the [facility] behavioral health outpatient for medication management and the shot clinic. She states that the patient makes his own choices and has to stand on his own. She states that the patient is not allowed to return back to her house but he is able to come at the 1:00 pm today when she gets off work to pick up his stuff. She has no other safety concerns at this time.   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  Past Medical History:  Past Medical History:  Diagnosis Date   Sickle cell anemia (HCC) 09/25/2020   Phreesia 11/10/2020   Tourette syndrome    No past surgical history on file. Family History:  Family History  Problem Relation Age of Onset   Scoliosis Mother    Family Psychiatric History: Mother "has schizophrenia" Social History:  Social History   Substance and Sexual Activity  Alcohol Use Never     Social History   Substance and Sexual Activity  Drug Use Not Currently   Types: Marijuana    Social History   Socioeconomic History   Marital status: Single    Spouse name: Not on file   Number of children: Not on file   Years of education: Not on file   Highest education level: Not on file  Occupational History   Not on file  Tobacco Use   Smoking status: Never   Smokeless tobacco: Never  Vaping Use   Vaping Use: Former  Substance and Sexual Activity   Alcohol use: Never   Drug use: Not Currently    Types: Marijuana   Sexual activity: Not Currently    Birth control/protection: None  Other Topics Concern   Not on file  Social History Narrative   ** Merged History Encounter **       Social Determinants of Health   Financial Resource Strain: Not on file  Food Insecurity: Not on file  Transportation Needs: Not on file  Physical Activity: Not on file  Stress: Not on file   Social Connections: Not on file   SDOH:  SDOH Screenings   Alcohol Screen: Not on file  Depression (PHQ2-9): Low Risk  (11/13/2020)   Depression (PHQ2-9)    PHQ-2 Score: 0  Financial Resource Strain: Not on file  Food Insecurity: Not on file  Housing: Not on file  Physical Activity: Not on file  Social Connections: Not on file  Stress: Not on file  Tobacco Use: Low Risk  (05/15/2022)   Patient History    Smoking Tobacco Use: Never    Smokeless Tobacco Use: Never    Passive Exposure: Not on file  Transportation Needs: Not on file    Tobacco Cessation:  N/A, patient does not currently use tobacco products  Current Medications:  Current Facility-Administered Medications  Medication Dose Route Frequency Provider Last Rate Last Admin   acetaminophen (TYLENOL) tablet 650 mg  650 mg Oral Q6H PRN Onuoha, Chinwendu V, NP   650 mg at 05/16/22 0134   alum & mag hydroxide-simeth (MAALOX/MYLANTA) 200-200-20 MG/5ML suspension 30 mL  30 mL Oral Q4H PRN Onuoha, Chinwendu V, NP       hydrOXYzine (ATARAX) tablet 25 mg  25 mg  Oral TID PRN Onuoha, Chinwendu V, NP       magnesium hydroxide (MILK OF MAGNESIA) suspension 30 mL  30 mL Oral Daily PRN Onuoha, Chinwendu V, NP       OLANZapine (ZYPREXA) tablet 5 mg  5 mg Oral BID Teila Skalsky L, NP       traZODone (DESYREL) tablet 50 mg  50 mg Oral QHS PRN Onuoha, Chinwendu V, NP       Current Outpatient Medications  Medication Sig Dispense Refill   OLANZapine (ZYPREXA) 10 MG tablet Take one tablet (10 mg) two times daily 60 tablet 0    PTA Medications: (Not in a hospital admission)      11/13/2020    8:39 AM 09/01/2020    9:29 AM  Depression screen PHQ 2/9  Decreased Interest 0 1  Down, Depressed, Hopeless 0 1  PHQ - 2 Score 0 2  Altered sleeping 0 0  Tired, decreased energy 0 1  Change in appetite 0 0  Feeling bad or failure about yourself  0 0  Trouble concentrating 0 0  Moving slowly or fidgety/restless 0 0  Suicidal thoughts 0 0   PHQ-9 Score 0 3  Difficult doing work/chores Not difficult at all     Flowsheet Row ED from 05/15/2022 in [facility] Most recent reading at 05/16/2022  1:52 AM [facility] Most recent reading at 05/15/2022  7:44 PM ED from 05/15/2022 in [facility] Most recent reading at 05/15/2022 12:12 PM  C-SSRS RISK CATEGORY High Risk Low Risk High Risk       Musculoskeletal  Strength & Muscle Tone: within normal limits Gait & Station: normal Patient leans: N/A  Psychiatric Specialty Exam  Presentation  General Appearance: Appropriate for Environment  Eye Contact:Fair  Speech:Clear and Coherent  Speech Volume:Normal  Handedness:Right   Mood and Affect  Mood:Dysphoric  Affect:Congruent   Thought Process  Thought Processes:Coherent; Linear  Descriptions of Associations:Intact  Orientation:Full (Time, Place and Person)  Thought Content:Logical  Diagnosis of Schizophrenia or Schizoaffective disorder in past: No  Duration of Psychotic Symptoms: Greater than six months   Hallucinations:Hallucinations: Auditory; Visual Description of Auditory Hallucinations: "voices that tell me to punch someone,like I did today".  Ideas of Reference:None  Suicidal Thoughts:Suicidal Thoughts: No SI Active Intent and/or Plan: With Intent; With Plan  Homicidal Thoughts:Homicidal Thoughts: No   Sensorium  Memory:Immediate Fair; Recent Fair; Remote Fair  Judgment:Intact  Insight:Shallow   Executive Functions  Concentration:Fair  Attention Span:Fair  Recall:Fair  Fund of Knowledge:Fair  Language:Fair   Psychomotor Activity  Psychomotor Activity:Psychomotor Activity: Normal   Assets  Assets:Communication Skills; Desire for Improvement; Leisure Time; Physical Health   Sleep  Sleep:Sleep: Fair Number of Hours of Sleep: 5   Nutritional Assessment (For  OBS and FBC admissions only) Has the patient had a weight loss or gain of 10 pounds or more in the last 3 months?: No Has the patient had a decrease in food intake/or appetite?: No Does the patient have dental problems?: No Does the patient have eating habits or behaviors that may be indicators of an eating disorder including binging or inducing vomiting?: No Has the patient recently lost weight without trying?: 0 Has the patient been eating poorly because of a decreased appetite?: 0 Malnutrition Screening Tool Score: 0    Physical Exam  Physical Exam HENT:     Head: Normocephalic.     Nose: Nose normal.  Eyes:     Conjunctiva/sclera: Conjunctivae normal.  Cardiovascular:     Rate and Rhythm: Normal rate.  Musculoskeletal:     Cervical back: Normal range of motion.  Neurological:     Mental Status: He is alert and oriented to person, place, and time.    ROS Blood pressure 112/72, pulse (!) 56, temperature 97.8 F (36.6 C), temperature source Oral, resp. rate 18, SpO2 100 %. There is no height or weight on file to calculate BMI.  Demographic Factors:  Male, Living alone, and Unemployed  Loss Factors: Financial problems/change in socioeconomic status  Historical Factors: Prior suicide attempts and Impulsivity  Risk Reduction Factors:   Sense of responsibility to family and Religious beliefs about death  Continued Clinical Symptoms:  Previous Psychiatric Diagnoses and Treatments  Cognitive Features That Contribute To Risk:  None    Suicide Risk:  Minimal: No identifiable suicidal ideation.  Patients presenting with no risk factors but with morbid ruminations; may be classified as minimal risk based on the severity of the depressive symptoms  Plan Of Care/Follow-up recommendations:  Activity:  as tolerated  Your medications were sent on 05/08/22 to the pharmacy. Pick up these medications at [facility] [address], [place] - [address]  [address] [place] [address] Phone: [phone] OLANZapine 10 mg by mouth two times daily  Discharge recommendations:  Patient is to take medications as prescribed. Please see information for follow-up appointment with psychiatry and therapy. Please follow up with your primary care provider for all medical related needs.   Therapy: We recommend that patient participate in individual therapy to address mental health concerns.  Medications: The parent/guardian is to contact a medical professional and/or outpatient provider to address any new side effects that develop. Parent/guardian should update outpatient providers of any new medications and/or medication changes.   Atypical antipsychotics: If you are prescribed an atypical antipsychotic, it is recommended that your height, weight, BMI, blood pressure, fasting lipid panel, and fasting blood sugar be monitored by your outpatient providers.  Safety:  The patient should abstain from use of illicit substances/drugs and abuse of any medications. If symptoms worsen or do not continue to improve or if the patient becomes actively suicidal or homicidal then it is recommended that the patient return to the closest hospital emergency department, the [facility], or call 911 for further evaluation and treatment. National Suicide Prevention Lifeline 1-800-SUICIDE or (402)867-7547.  About 988 988 offers 24/7 access to trained crisis counselors who can help people experiencing mental health-related distress. People can call or text 988 or chat 988lifeline.org for themselves or if they are worried about a loved one who may need crisis support.    Disposition: discharge to self. Shelter/housing resources provided.   Sarahbeth Cashin L, NP 05/16/2022, 8:15 AM
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
## 2022-05-16 NOTE — ED Notes (Signed)
Patient A&O x 4, ambulatory. Patient discharged in no acute distress. Patient denied SI/HI, A/VH upon discharge. Patient verbalized understanding of all discharge instructions explained by staff, to include follow up appointments, RX's and safety plan. Pt belongings returned to patient from locker #21 intact. Patient escorted to lobby via staff with bus pass. Safety maintained.

## 2022-05-16 NOTE — Progress Notes (Signed)
EKG completed and in patients chart.

## 2022-05-16 NOTE — ED Notes (Signed)
Pt belongings placed in a locker- pt was allowed to keep out one paperback book. Book was checked for contraband, none was found

## 2022-05-16 NOTE — ED Notes (Signed)
TTS consult now

## 2022-05-16 NOTE — BH Assessment (Signed)
Clinician messaged Pecola Lawless, RN: "Hey. It's Trey with TTS. Is the pt able to engage in the assessment, if so the pt will need to be placed in a private room. Is the pt under IVC? Also is the pt medically cleared?"   Clinician waiting response.    Redmond Pulling, MS, [facility], [facility] Triage Specialist [phone]
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
## 2022-05-16 NOTE — ED Notes (Signed)
Pt watching tv and interacting with another peer. No acute distress noted. Informed pt to notify staff with any needs or concerns. Pt verbalized agreement. Will continue to monitor for safety.

## 2022-05-16 NOTE — ED Notes (Addendum)
D: Pt here voluntarily as a  walk-in. Pt denies SI/HI/AVH at this time. Pt states he has suicidal ideation on and off. Pt is in difficult relationship with his mother, whom he lives with. Pt has ACE bandage on his left hand and has difficulty using his hand. Pt states that his hand was x-rayed and nothing is broken. Pt rates pain 20/10 and said it is constant. Pt admits to having some intent of acting on his self-harm thoughts but no plan. Pt contracts for safety. Pt admits to smoking marijuana heavily on a daily basis. Pt denies access to guns, smoking cigarettes and drinking alcohol.  A: Pt was offered support and encouragement. Pt is cooperative during assessment. VS assessed and admission paperwork signed. Belongings searched and contraband items placed in locker. Non-invasive skin search completed: keloid scars to left upper arm, tattoos to right and left arm and ACE bandage to left hand. Pt offered food and drink and both accepted. Pt introduced to unit milieu by nursing staff.   R: Pt in bed 3. Pt safety maintained on unit.

## 2022-05-16 NOTE — ED Notes (Signed)
Pt sleeping in no acute distress. RR even and unlabored. Safety maintained. 

## 2022-05-16 NOTE — ED Notes (Signed)
Pt asleep at this hour. No apparent distress. RR even and unlabored. Monitored for safety.  

## 2022-05-16 NOTE — ED Notes (Signed)
Pt sleeping but easily aroused to name being called. Calm, cooperative with staff. Pt denies SI/HI but continue to endorse AH. Pt states, :the voices are talking to me right now. They are asking how I am doing?Marland Kitchen They aren't telling me to do bad things. That medicine (Zyprexa) helps me a lot. Since I took that, the voices are getting better. That's why I need to stay here a little while, to make sure the medicine work completely". Support given. Pt c/o L wrist pain 8/10. Requested Tylenol. Medication given. Pt requested to take a shower. Awaiting shower room. Informed pt to notify staff with any needs or concerns. Will continue to monitor for safety.

## 2022-05-16 NOTE — ED Provider Triage Note (Signed)
Emergency Medicine Provider Triage Evaluation Note  Amin Fornwalt , a 21 y.o. male  was evaluated in triage.  Pt complains of suicidal ideations and requesting mental health evaluation.  Patient has been evaluated multiple times in the past few days.  He had initially denied SI or HI.  He does currently report suicidal ideations with plan to try and drown himself in his mom's pool at her apartment complex.  He reports that he hears voices that tell him to do things and he feels like he is not in control of his actions.  He reports that he has tried to take medications but feels like he cannot get himself stabilized and feels like he needs to be admitted again.  No medical complaints.  Review of Systems  Positive: Suicidal ideations, AVH Negative: Pain, abdominal pain, shortness of breath, homicidal ideations  Physical Exam  BP 120/75   Pulse 77   Temp 98.8 F (37.1 C) (Oral)   Resp 18   SpO2 99%  Gen:   Awake, no distress   Resp:  Normal effort  MSK:   Moves extremities without difficulty  Other:    Medical Decision Making  Medically screening exam initiated at 8:49 PM.  Appropriate orders placed.  Vale Mousseau was informed that the remainder of the evaluation will be completed by another provider, this initial triage assessment does not replace that evaluation, and the importance of remaining in the ED until their evaluation is complete.  Patient reports suicidal ideations with plan, requesting reevaluation.  Had labs done yesterday so will not repeat medical clearance from [place] at this time.   Dartha Lodge, [place] 05/16/22 2105

## 2022-05-16 NOTE — Discharge Instructions (Addendum)
Your medications were sent on 05/08/22 to the pharmacy. Pick up these medications at [facility] [address] [place] - [address]  [address] [place] [address] Phone: [phone] OLANZapine 10 mg by mouth two times daily  Discharge recommendations:  Patient is to take medications as prescribed. Please see information for follow-up appointment with psychiatry and therapy. Please follow up with your primary care provider for all medical related needs.   Therapy: We recommend that patient participate in individual therapy to address mental health concerns.  Medications: The parent/guardian is to contact a medical professional and/or outpatient provider to address any new side effects that develop. Parent/guardian should update outpatient providers of any new medications and/or medication changes.   Atypical antipsychotics: If you are prescribed an atypical antipsychotic, it is recommended that your height, weight, BMI, blood pressure, fasting lipid panel, and fasting blood sugar be monitored by your outpatient providers.  Safety:  The patient should abstain from use of illicit substances/drugs and abuse of any medications. If symptoms worsen or do not continue to improve or if the patient becomes actively suicidal or homicidal then it is recommended that the patient return to the closest hospital emergency department, the [facility], or call 911 for further evaluation and treatment. National Suicide Prevention Lifeline 1-800-SUICIDE or (910)851-8717.  About 988 988 offers 24/7 access to trained crisis counselors who can help people experiencing mental health-related distress. People can call or text 988 or chat 988lifeline.org for themselves or if they are worried about a loved one who may need crisis support.     [facility] [address], [place], [address] [phone] phone   New  Patient Assessment/Therapy Walk-Ins:  Monday and Wednesday: 8 am until slots are full. Every 1st and 2nd Fridays of the month: 1 pm - 5 pm.  NO ASSESSMENT/THERAPY WALK-INS ON TUESDAYS OR THURSDAYS  New Patient Assessment/Medication Management Walk-Ins:  Monday - Friday:  8 am - 11 am.  For all walk-ins, we ask that you arrive by 7:30 am because patients will be seen in the order of arrival.  Availability is limited; therefore, you may not be seen on the same day that you walk-in.  Our goal is to serve and meet the needs of our community to the best of our ability.   Refugee and Immigrant Social Work Program - [employer] [facility] [employer] [place] Helping Immigrants and Refugees with Employment - [facility] [employer] [place] [address] [place], [place], [address] [phone] phone Monday-Friday, 9am-5pm   Refugee Assistance and [facility] - [facility] [address] [place], [place], [address] [phone] phone  Offers case management services and resettlement services to newly arriving refugees.  Helps clients with becoming self-sufficient. Services may include employment services, vocational training and assistance, Ship broker, and naturalization assistance.   Immigrant Assistance Center - [employer] [address] [place], [place], [address] [phone] phone Monday, Wednesday, and Friday, 9am-5pm Tuesday and Thursday, 10am-6pm Walk-in Hours are Tuesday and Thursday, 2pm-6pm, and Wednesday, 9am-1pm.  Offers information and referral for refugees and immigrants. Offers help with basic needs such as food, baby diapers, and health screening. Also offers help with menthal health, advocacy, financial counseling, and more.  [facility] [address] [place], [place], [address] [phone] phone Monday-Friday, 10am-5pm  [address] [employer] [address] [place], [address] [phone] phone  Shelter/Housing  [facility] [address] [place], [place], [address] [phone] phone  Offers transitional housing for families and single people experiencing homelessness. Residents meet regularly with a case manager to work towards self-sufficiency.

## 2022-05-17 ENCOUNTER — Emergency Department (HOSPITAL_COMMUNITY): Payer: Medicaid Other

## 2022-05-17 MED ORDER — ACETAMINOPHEN 325 MG PO TABS
650.0000 mg | ORAL_TABLET | Freq: Once | ORAL | Status: AC
  Administered 2022-05-17: 650 mg via ORAL
  Filled 2022-05-17: qty 2

## 2022-05-17 MED ORDER — OLANZAPINE 10 MG PO TABS
10.0000 mg | ORAL_TABLET | Freq: Once | ORAL | Status: AC
  Administered 2022-05-17: 10 mg via ORAL
  Filled 2022-05-17: qty 1

## 2022-05-17 MED ORDER — ACETAMINOPHEN 325 MG PO TABS
ORAL_TABLET | ORAL | Status: AC
  Filled 2022-05-17: qty 1

## 2022-05-17 NOTE — ED Notes (Signed)
Patient given discharge instructions, belongings taken out of locker and given back to patient will get vitals prior to discharge.

## 2022-05-17 NOTE — Discharge Instructions (Addendum)
The x-rays of your left wrist and hand do not show any fractures.  We have placed you into a brace, please wear this at all times until you are able to follow-up with hand surgery, please see Dr. Frazier Butt information in your discharge instructions.  Please follow [facility] therapy.  Please take your Zyprexa as prescribed.  Follow-up behavioral health resources as soon as possible.  Follow-up with primary care soon as possible.  Return to the ER for any new or worsening symptoms or any other concerns.
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
## 2022-11-28 ENCOUNTER — Emergency Department (HOSPITAL_COMMUNITY)
Admission: EM | Admit: 2022-11-28 | Discharge: 2022-11-28 | Disposition: A | Discharge status: Home / Self Care | Payer: Medicaid Other | Attending: Emergency Medicine | Admitting: Emergency Medicine

## 2022-11-28 ENCOUNTER — Other Ambulatory Visit: Payer: Self-pay

## 2022-11-28 ENCOUNTER — Encounter (HOSPITAL_COMMUNITY): Payer: Self-pay | Admitting: *Deleted

## 2022-11-28 DIAGNOSIS — Z760 Encounter for issue of repeat prescription: Secondary | ICD-10-CM | POA: Insufficient documentation

## 2022-11-28 DIAGNOSIS — F209 Schizophrenia, unspecified: Secondary | ICD-10-CM | POA: Insufficient documentation

## 2022-11-28 HISTORY — DX: Schizophrenia, unspecified: F20.9

## 2022-11-28 HISTORY — DX: Bipolar disorder, unspecified: F31.9

## 2022-11-28 MED ORDER — OLANZAPINE 10 MG PO TABS
10.0000 mg | ORAL_TABLET | Freq: Two times a day (BID) | ORAL | 0 refills | Status: AC
Start: 2022-11-28 — End: 2022-12-28

## 2022-11-28 NOTE — ED Triage Notes (Signed)
Pt has been out of his medication since Jan. Called his doctor and was told to come to the ED for refill.

## 2022-11-28 NOTE — ED Provider Notes (Signed)
[facility] Provider Note   CSN: [id number] Arrival date & time: 11/28/22  1145     History  Chief Complaint  Patient presents with   Medication Refill    Cody Garrett is a 22 y.o. male with history of sickle cell anemia, bipolar 1 disorder, schizophrenia, Tourette's syndrome who presents the emergency department requesting a medication refill.  Only on 10 mg of olanzapine twice daily for schizophrenia.  States that this was just prescribed to him in January.  Finished a months worth and put himself on a drug holiday to see how he would feel.  He called his doctor today about his symptoms and they recommended he come to the ER for medication refill. Says he has overall been feeling well and has no symptoms at present.    Medication Refill      Home Medications Prior to Admission medications   Medication Sig Start Date End Date Taking? Authorizing Provider  OLANZapine (ZYPREXA) 10 MG tablet Take 1 tablet (10 mg total) by mouth in the morning and at bedtime. 11/28/22 12/28/22  Shalynn Jorstad T, PA-C      Allergies    Patient has no known allergies.    Review of Systems   Review of Systems  All other systems reviewed and are negative.   Physical Exam Updated Vital Signs Ht '5\' 11"'$  (1.803 m)   Wt 74.8 kg   BMI 23.01 kg/m  Physical Exam Vitals and nursing note reviewed.  Constitutional:      Appearance: Normal appearance.  HENT:     Head: Normocephalic and atraumatic.  Eyes:     Conjunctiva/sclera: Conjunctivae normal.  Pulmonary:     Effort: Pulmonary effort is normal. No respiratory distress.  Skin:    General: Skin is warm and dry.  Neurological:     Mental Status: He is alert.  Psychiatric:        Mood and Affect: Mood normal.        Behavior: Behavior normal.     ED Results / Procedures / Treatments   Labs (all labs ordered are listed, but only abnormal results are displayed) Labs Reviewed - No data to  display  EKG None  Radiology No results found.  Procedures Procedures    Medications Ordered in ED Medications - No data to display  ED Course/ Medical Decision Making/ A&P                             Medical Decision Making Risk Prescription drug management.   Patient is a 22 year old male who presents to the ER for medication refill.  PMH of sickle cell anemia, bipolar 1 disorder, schizophrenia, Tourette's syndrome  Per chart review, previously on 10 mg olanzapine twice daily for schizophrenia, first prescribed in Jan 2024.  Patient has no complaints at this time.   Sent 30 day supply to the pharmacy at patient's request. Encouraged PCP follow up for ongoing medication management.   Final Clinical Impression(s) / ED Diagnoses Final diagnoses:  Medication refill  Schizophrenia, unspecified type ([place])    Rx / DC Orders ED Discharge Orders          Ordered    OLANZapine (ZYPREXA) 10 MG tablet  2 times daily        11/28/22 1233           Portions of this report may have been transcribed using voice recognition  software. Every effort was made to ensure accuracy; however, inadvertent computerized transcription errors may be present.    Estill Cotta 11/28/22 1344    Cristie Hem, MD 12/06/22 1512

## 2022-11-28 NOTE — Discharge Instructions (Addendum)
You were seen in the ER for a refill of your Olanzapine medication.  Please take 1 tablet ('10mg'$ ) twice daily. Follow up with your doctor about your medications, and to let them know you were off of it for a bit.
